# Patient Record
Sex: Female | Born: 1965 | Race: White | Hispanic: No | Marital: Married | Smoking: Never smoker
Health system: Southern US, Community
[De-identification: ages and names within clinical notes are randomized; demographics above are authoritative.]

## PROBLEM LIST (undated history)

## (undated) ENCOUNTER — Ambulatory Visit (HOSPITAL_COMMUNITY): Discharge status: Absent without leave | Payer: Self-pay

## (undated) DIAGNOSIS — E079 Disorder of thyroid, unspecified: Secondary | ICD-10-CM

## (undated) DIAGNOSIS — E785 Hyperlipidemia, unspecified: Secondary | ICD-10-CM

## (undated) DIAGNOSIS — I10 Essential (primary) hypertension: Secondary | ICD-10-CM

## (undated) HISTORY — PX: OTHER SURGICAL HISTORY: SHX169

## (undated) HISTORY — PX: APPENDECTOMY: SHX54

---

## 1996-11-15 HISTORY — PX: ANKLE SURGERY: SHX546

## 1999-05-23 ENCOUNTER — Emergency Department (HOSPITAL_COMMUNITY): Admission: EM | Admit: 1999-05-23 | Discharge: 1999-05-23 | Payer: Self-pay | Admitting: Emergency Medicine

## 2004-12-15 ENCOUNTER — Encounter: Admission: RE | Admit: 2004-12-15 | Discharge: 2004-12-15 | Payer: Self-pay | Admitting: Family Medicine

## 2005-12-17 ENCOUNTER — Encounter: Admission: RE | Admit: 2005-12-17 | Discharge: 2005-12-17 | Payer: Self-pay | Admitting: Family Medicine

## 2006-12-19 ENCOUNTER — Encounter: Admission: RE | Admit: 2006-12-19 | Discharge: 2006-12-19 | Payer: Self-pay | Admitting: Family Medicine

## 2007-11-16 HISTORY — PX: CHOLECYSTECTOMY: SHX55

## 2007-12-26 ENCOUNTER — Encounter: Admission: RE | Admit: 2007-12-26 | Discharge: 2007-12-26 | Payer: Self-pay | Admitting: Family Medicine

## 2008-01-10 ENCOUNTER — Emergency Department (HOSPITAL_COMMUNITY): Admission: EM | Admit: 2008-01-10 | Discharge: 2008-01-10 | Payer: Self-pay | Admitting: Emergency Medicine

## 2008-01-17 ENCOUNTER — Encounter (INDEPENDENT_AMBULATORY_CARE_PROVIDER_SITE_OTHER): Payer: Self-pay | Admitting: General Surgery

## 2008-01-18 ENCOUNTER — Inpatient Hospital Stay (HOSPITAL_COMMUNITY): Admission: RE | Admit: 2008-01-18 | Discharge: 2008-01-19 | Payer: Self-pay | Admitting: General Surgery

## 2008-01-22 ENCOUNTER — Ambulatory Visit: Payer: Self-pay | Admitting: Gastroenterology

## 2008-02-02 ENCOUNTER — Ambulatory Visit: Payer: Self-pay | Admitting: Gastroenterology

## 2008-02-02 LAB — CONVERTED CEMR LAB
ALT: 366 units/L — ABNORMAL HIGH (ref 0–35)
AST: 170 units/L — ABNORMAL HIGH (ref 0–37)
Albumin: 3 g/dL — ABNORMAL LOW (ref 3.5–5.2)
Alkaline Phosphatase: 134 units/L — ABNORMAL HIGH (ref 39–117)
Total Bilirubin: 3.2 mg/dL — ABNORMAL HIGH (ref 0.3–1.2)

## 2008-02-20 ENCOUNTER — Ambulatory Visit: Payer: Self-pay | Admitting: Gastroenterology

## 2008-02-20 LAB — CONVERTED CEMR LAB
ALT: 36 units/L — ABNORMAL HIGH (ref 0–35)
Albumin: 3.1 g/dL — ABNORMAL LOW (ref 3.5–5.2)
Bilirubin, Direct: 0.4 mg/dL — ABNORMAL HIGH (ref 0.0–0.3)
Total Protein: 6.7 g/dL (ref 6.0–8.3)

## 2008-03-14 ENCOUNTER — Encounter: Payer: Self-pay | Admitting: Gastroenterology

## 2008-03-14 ENCOUNTER — Ambulatory Visit (HOSPITAL_COMMUNITY): Admission: RE | Admit: 2008-03-14 | Discharge: 2008-03-14 | Payer: Self-pay | Admitting: Gastroenterology

## 2008-03-18 ENCOUNTER — Ambulatory Visit: Payer: Self-pay | Admitting: Gastroenterology

## 2008-12-26 ENCOUNTER — Ambulatory Visit (HOSPITAL_COMMUNITY): Admission: RE | Admit: 2008-12-26 | Discharge: 2008-12-26 | Payer: Self-pay | Admitting: Family Medicine

## 2009-01-22 IMAGING — CR DG CHEST 2V
2 series · 2 of 2 positions shown · non-contrast
Comparison: None.

CLINICAL DATA: Pre-op gallstones.  
 CHEST ? 2 VIEW:

[w chest pa *]
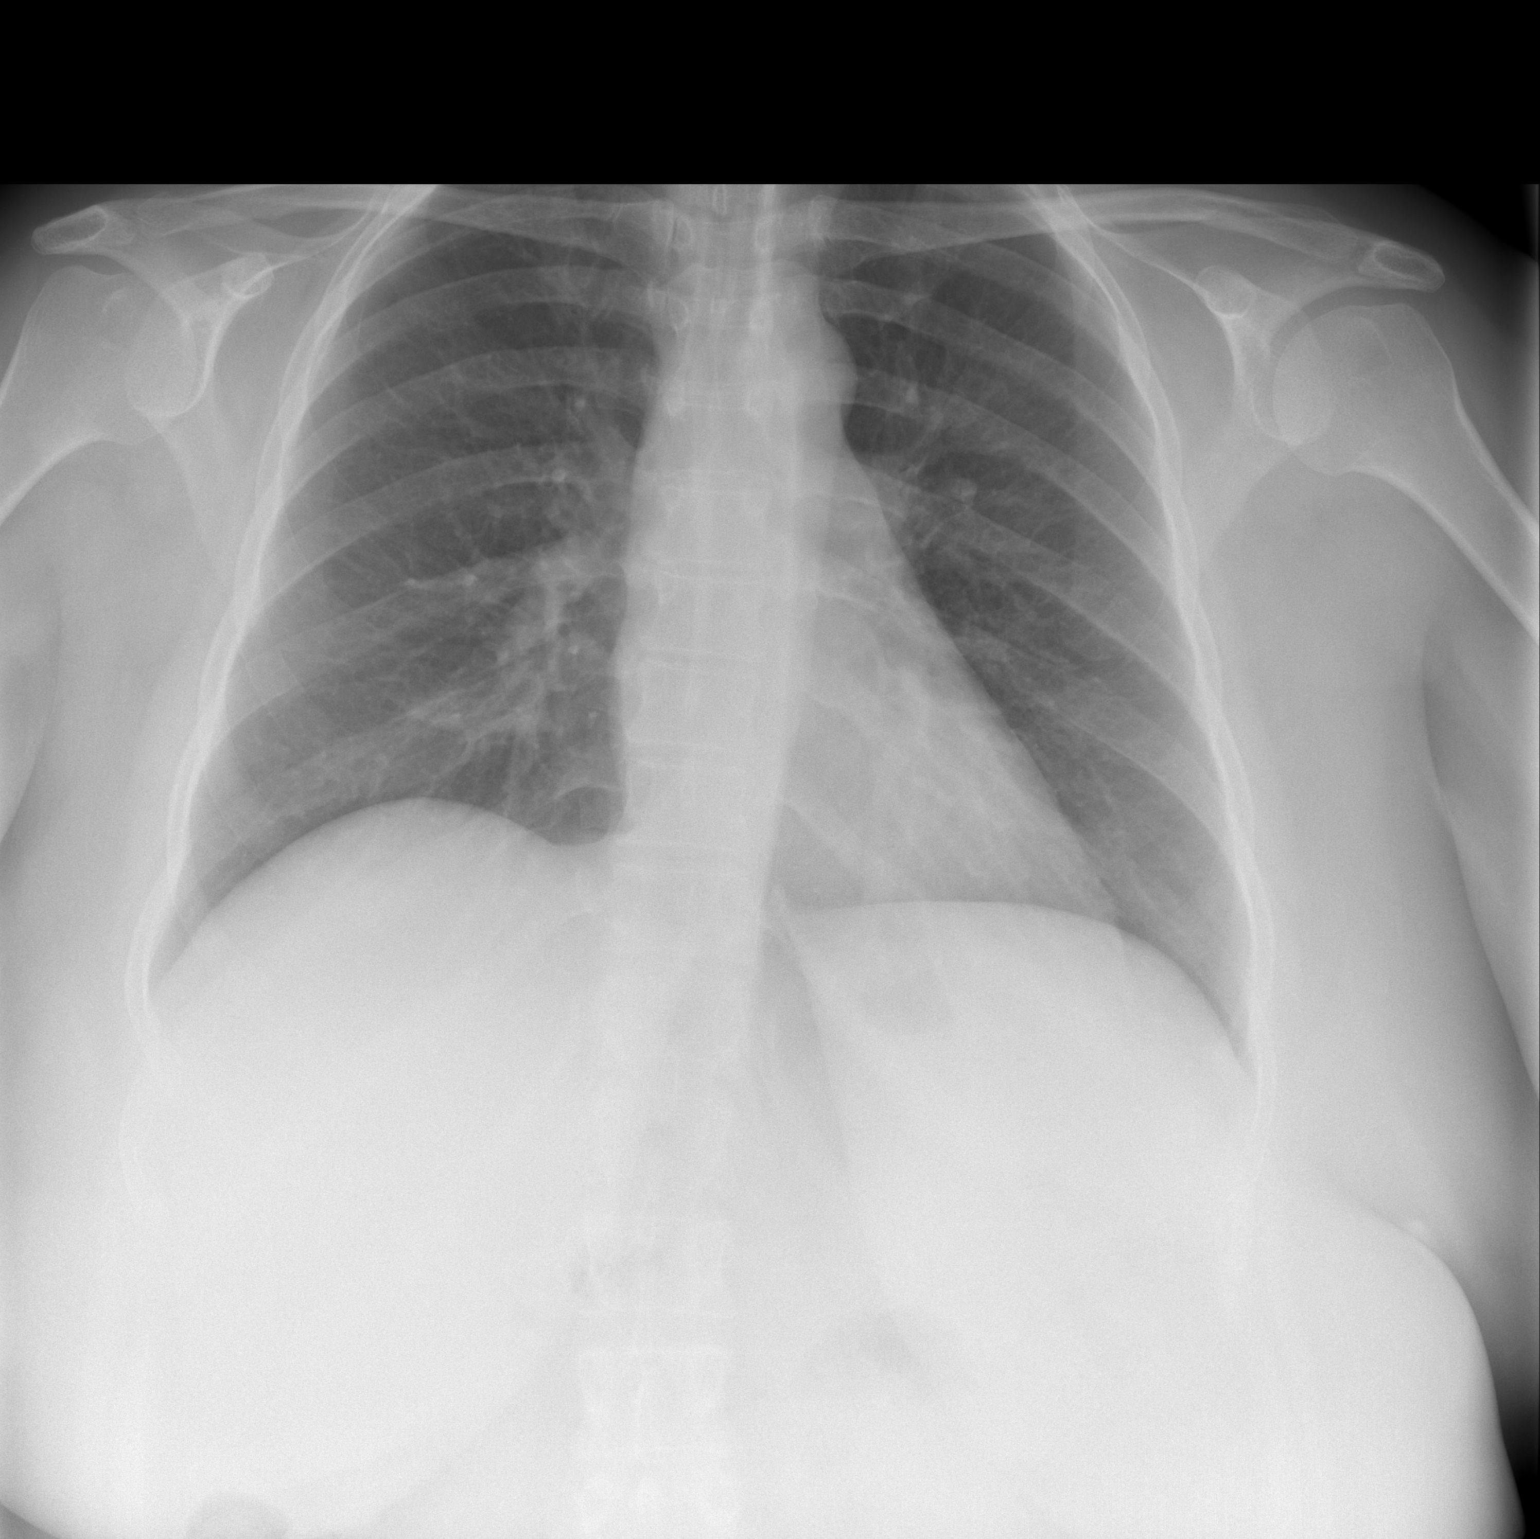

[w chest lat *]
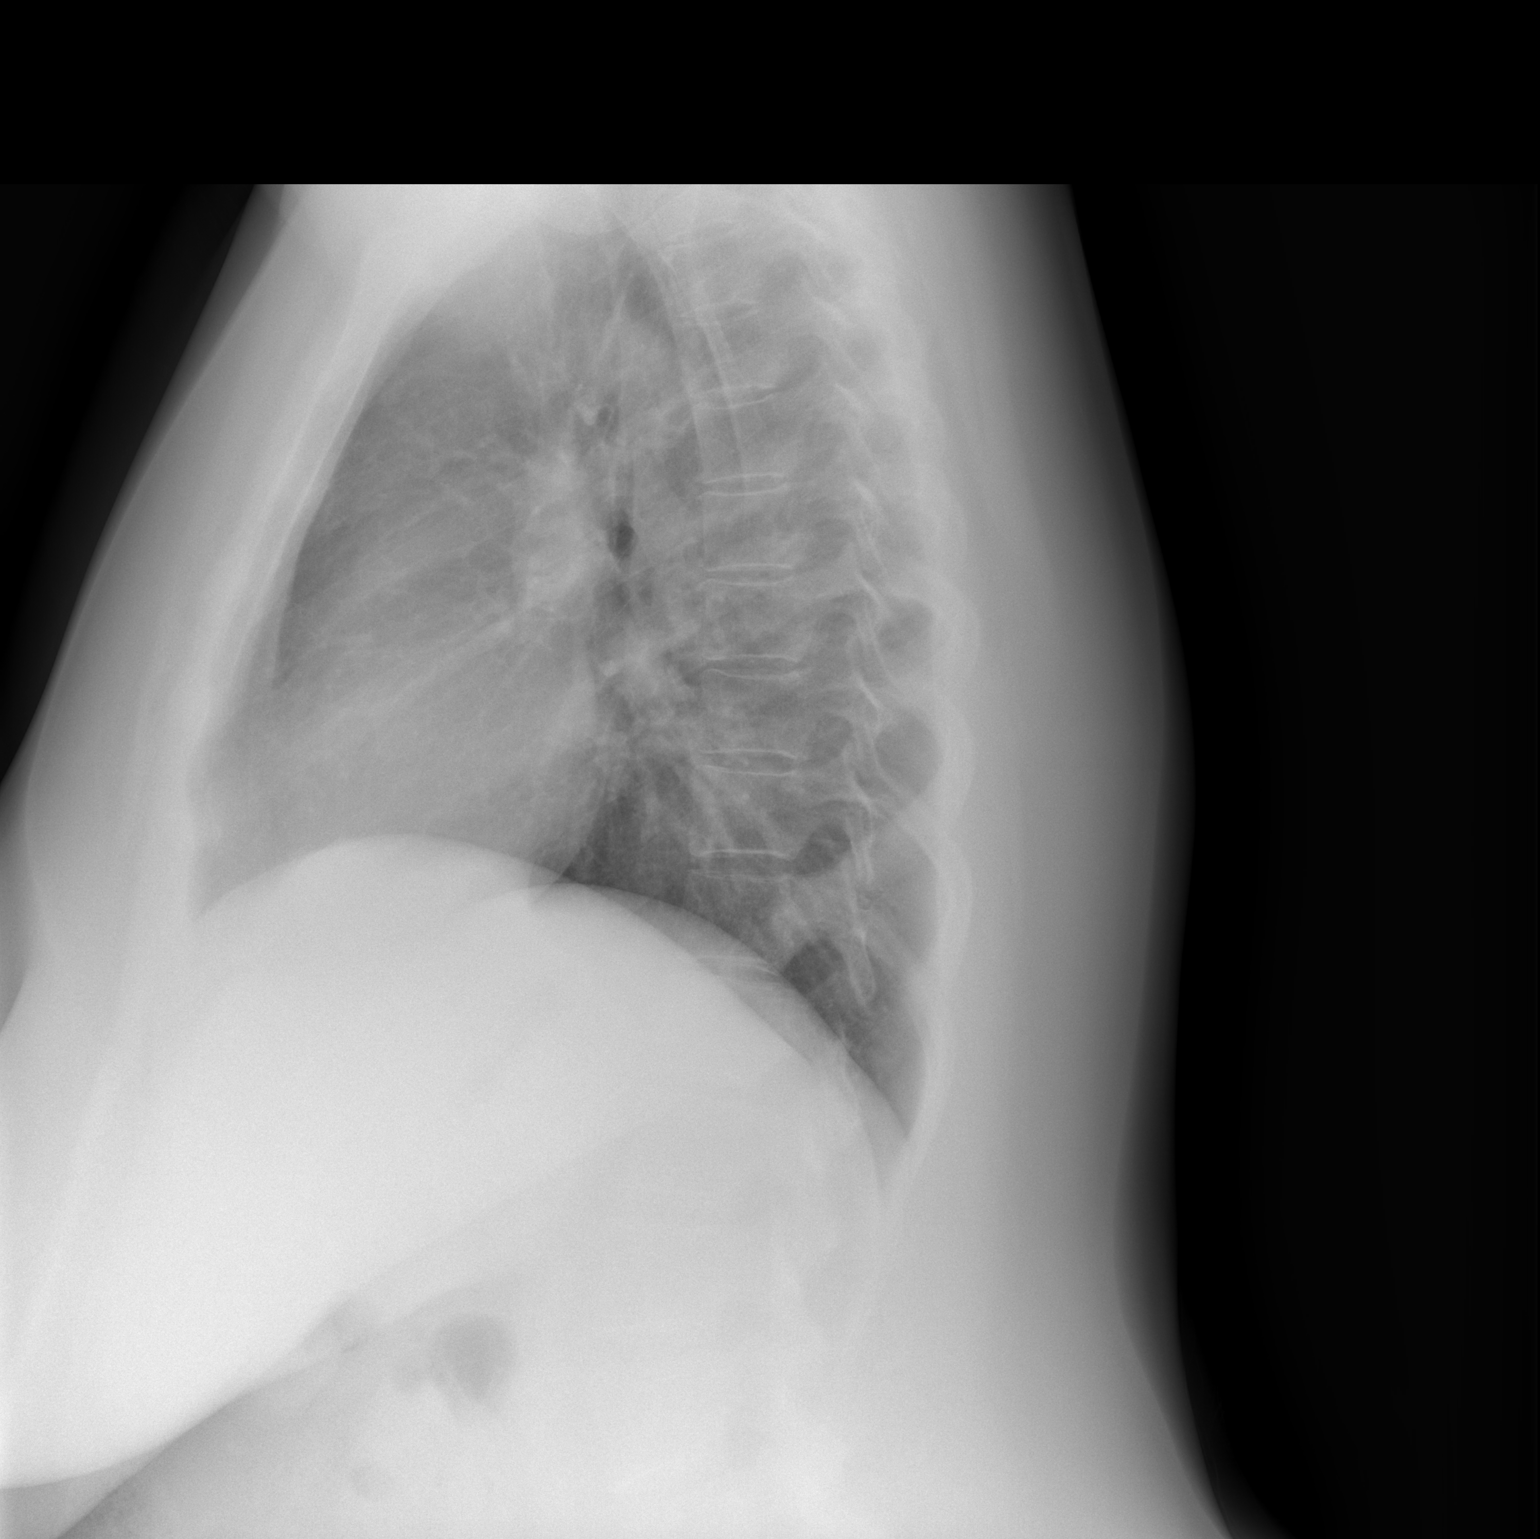

[2 of 2 positions shown; findings below may reference images not displayed]

FINDINGS: Trachea is midline.  Heart size normal.  Lungs are clear.  No pleural fluid.
IMPRESSION: No acute findings.

## 2009-01-22 IMAGING — RF DG CHOLANGIOGRAM OPERATIVE
1 series · 8 of 8 positions shown · non-contrast
Comparison: none

CLINICAL DATA: Gallstones

INTRAOPERATIVE CHOLANGIOGRAM:
TECHNIQUE: Multiple fluoroscopic spot radiographs were obtained during
intraoperative cholangiogram, and are submitted for interpretation
post-operatively.

[Series 1: run · 2 acquisitions, 8 frames shown]
[im 1/2]
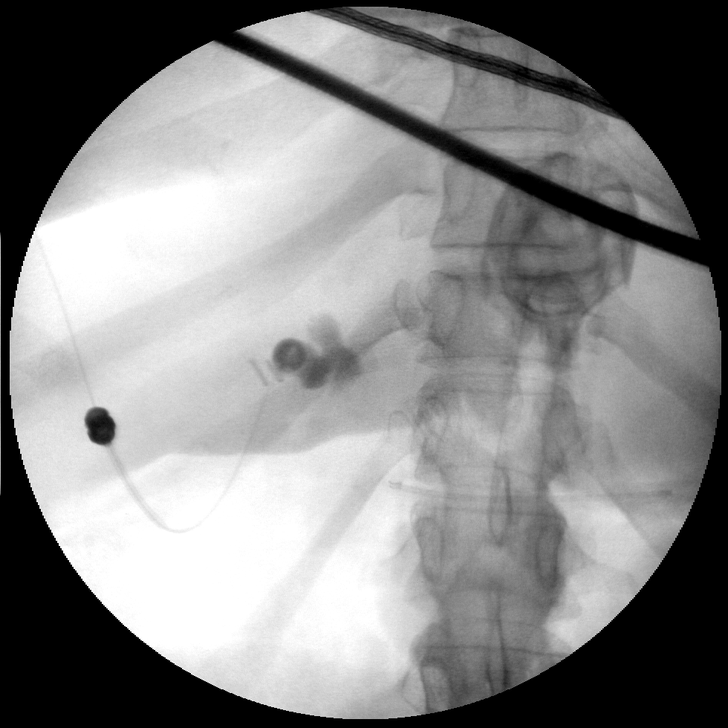
[im 1/2]
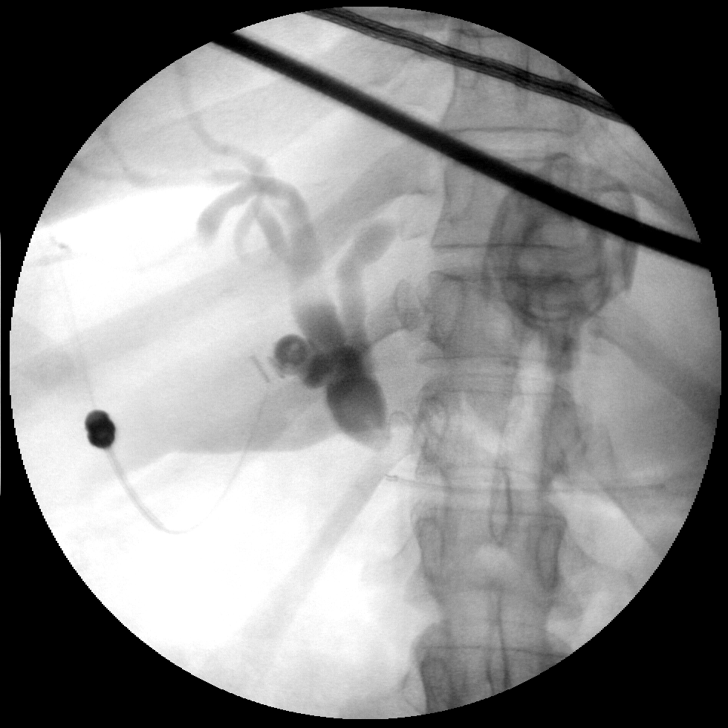
[im 1/2]
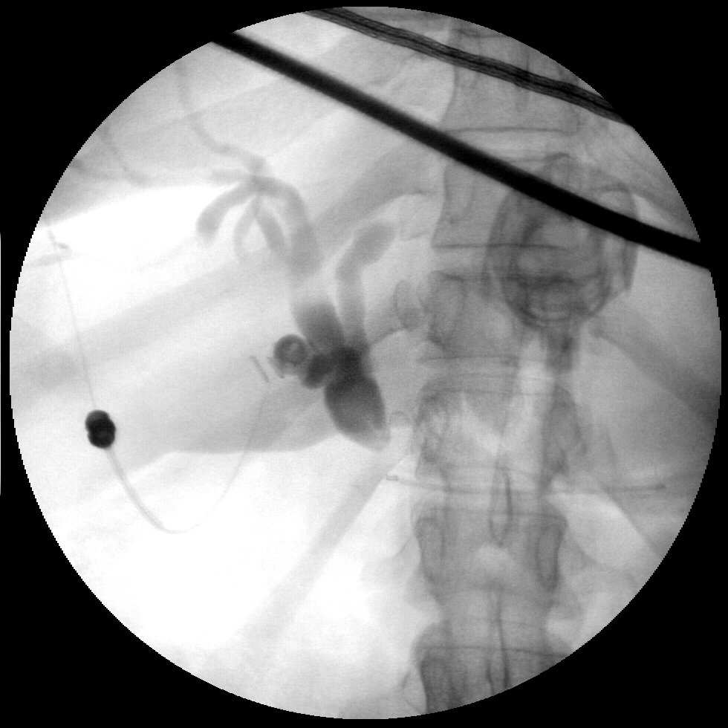
[im 1/2]
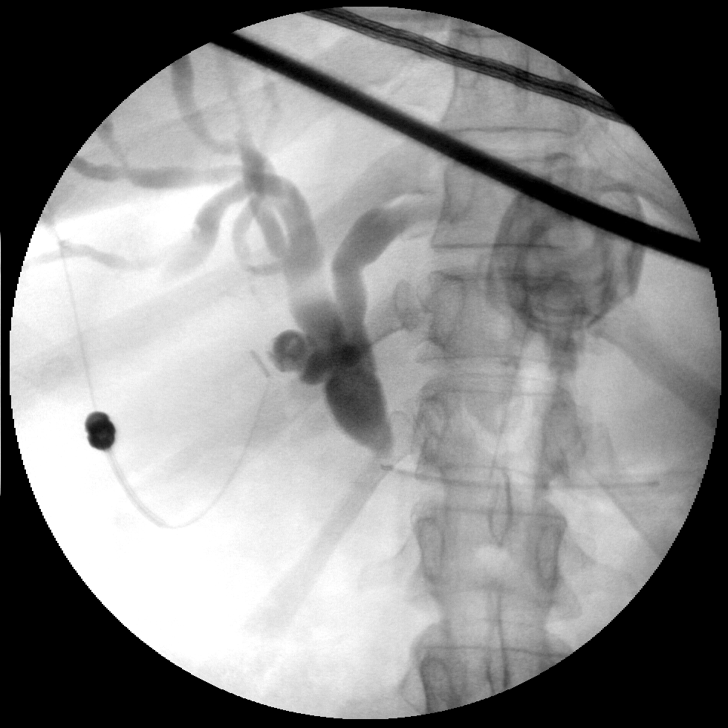
[im 2/2]
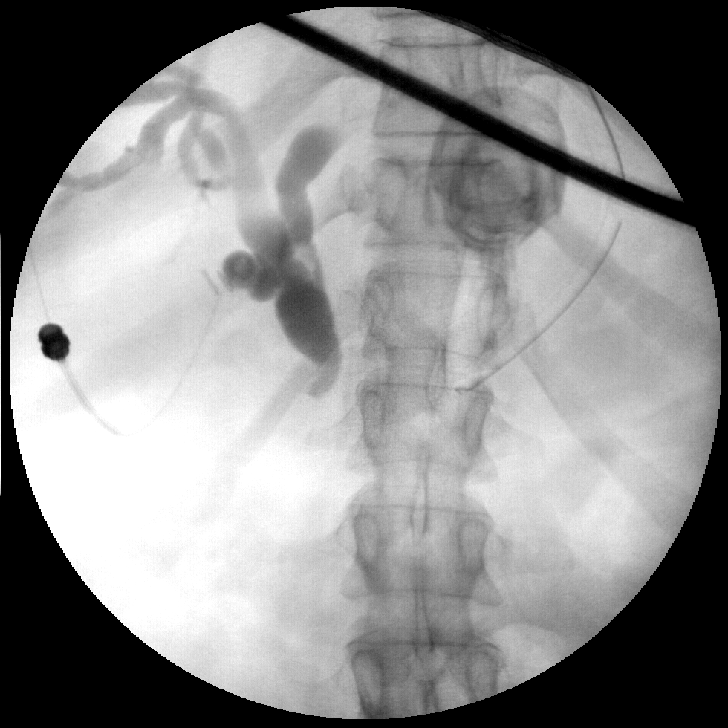
[im 2/2]
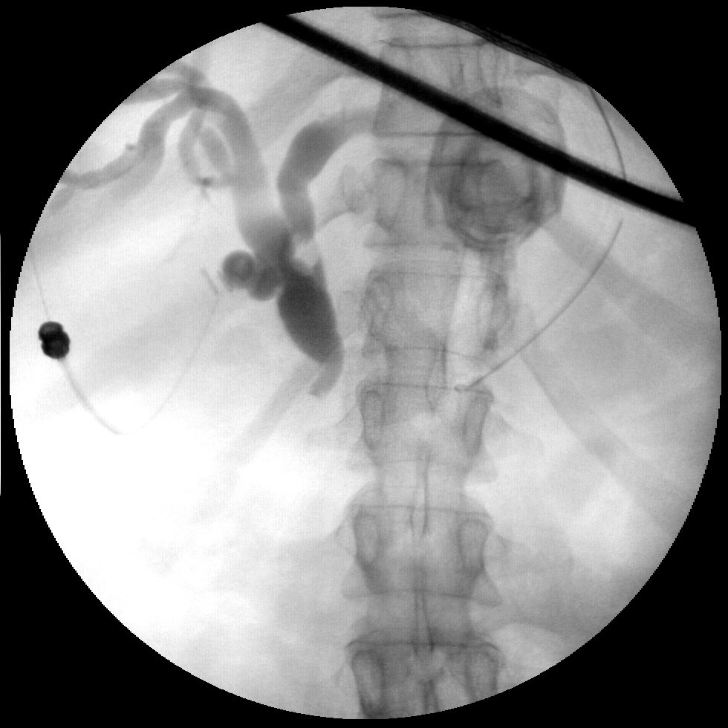
[im 2/2]
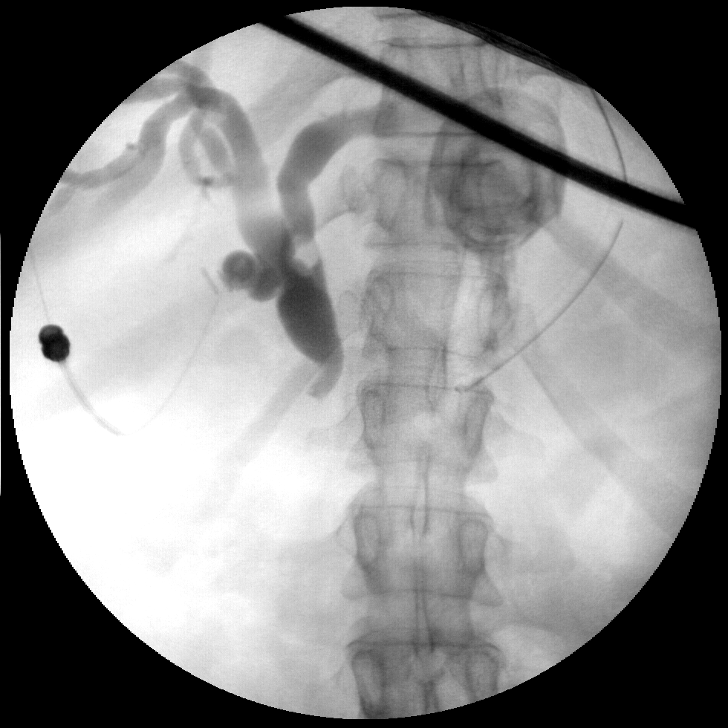
[im 2/2]
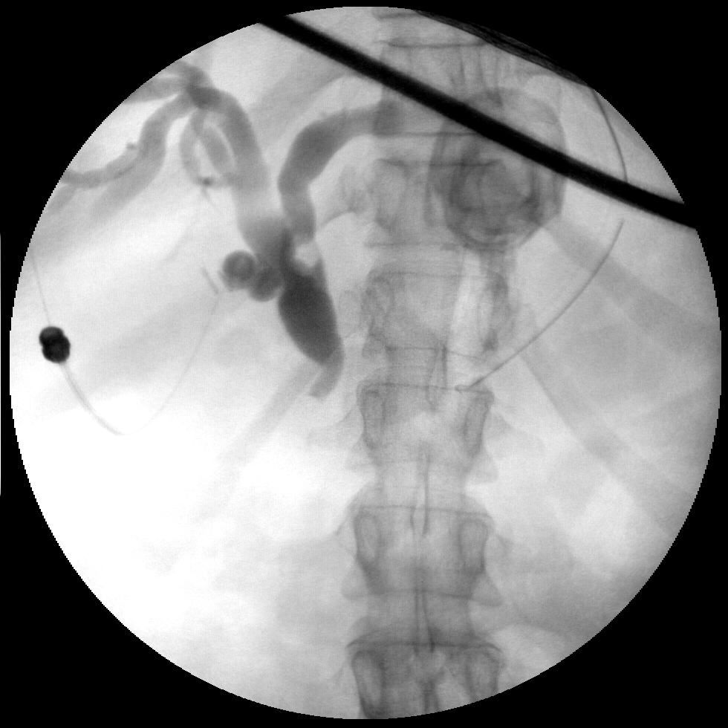

[8 of 8 positions shown; findings below may reference images not displayed]

FINDINGS: Two cine runs are submitted from intraoperative cholangiogram. The
showed dilated biliary system. There is a persistent filling defect noted in the
proximal common bile duct or possibly left common hepatic duct compatible with
large retained stone. Contrast never passes into the small bowel. Suspect distal
obstructing stone.
IMPRESSION: Retained stones with no passage of contrast into the small bowel. Biliary system
dilated.

## 2010-01-07 ENCOUNTER — Ambulatory Visit (HOSPITAL_COMMUNITY): Admission: RE | Admit: 2010-01-07 | Discharge: 2010-01-07 | Payer: Self-pay | Admitting: Family Medicine

## 2010-12-07 ENCOUNTER — Other Ambulatory Visit (HOSPITAL_COMMUNITY): Payer: Self-pay | Admitting: Family Medicine

## 2010-12-07 DIAGNOSIS — Z139 Encounter for screening, unspecified: Secondary | ICD-10-CM

## 2010-12-07 DIAGNOSIS — Z1231 Encounter for screening mammogram for malignant neoplasm of breast: Secondary | ICD-10-CM

## 2011-01-12 ENCOUNTER — Inpatient Hospital Stay (HOSPITAL_COMMUNITY): Admission: RE | Admit: 2011-01-12 | Payer: Self-pay | Source: Ambulatory Visit

## 2011-01-12 ENCOUNTER — Ambulatory Visit (HOSPITAL_COMMUNITY): Payer: Self-pay

## 2011-01-18 ENCOUNTER — Ambulatory Visit (HOSPITAL_COMMUNITY)
Admission: RE | Admit: 2011-01-18 | Discharge: 2011-01-18 | Disposition: A | Discharge status: Home / Self Care | Payer: Self-pay | Source: Ambulatory Visit | Attending: Family Medicine | Admitting: Family Medicine

## 2011-01-18 DIAGNOSIS — Z1231 Encounter for screening mammogram for malignant neoplasm of breast: Secondary | ICD-10-CM | POA: Insufficient documentation

## 2011-03-30 NOTE — Assessment & Plan Note (Signed)
[facility]                         GASTROENTEROLOGY OFFICE NOTE   Sharon, Gallegos                   MRN:          [id number]  DATE:02/02/2008                            DOB:          [birth year]    GENERAL SURGEON:  Dr. Thornton Dales.   GASTROINTESTINAL PROBLEM LIST:  Recent laparoscopic cholecystectomy for  symptomatic gallstones, March 2009, by Dr. Thornton Dales.  Intraoperative cholangiogram showed retained stones in the common bile  duct and the patient showed up the day of her procedure quite jaundiced.  Total bilirubin was 15.  Pathology confirmed chronic cholecystitis and  cholelithiasis.  Endoscopic retrograde cholangiopancreatogram performed  the day after her surgery showed a dilated common bile duct with  choledocholithiasis, treated with biliary sphincterotomy, lithotripsy,  balloon sweeping of the bile duct.  A 7-French 7-cm-long plastic removal  stent was placed into the duct.   INTERIM HISTORY:  Since leaving the hospital, Sharon Gallegos has felt very well.  Her pruritus has improved.  Her urine and stool have gone back to their  normal color.  The yellowing of her eyes is greatly improved.  She had  the drained pulled by Dr. Zachery Dakins shortly after she left the hospital.  She had no fever or chills.   CURRENT MEDICATIONS:  Birth control pills.   PHYSICAL EXAMINATION:  VITAL SIGNS:  Five feet 3 inches, 216 pounds.  Blood pressure 116/74, pulse 88.  CONSTITUTIONAL:  Obese, otherwise well appearing.  Sclerae are very slightly icteric, although it is tough to know with the  lightening in the examination room.  Certainly, she is not overtly  jaundiced.  ABDOMEN:  Obese, otherwise soft, nontender and non-distended with normal  bowel sounds.   ASSESSMENT AND PLAN:  [age]-year-old woman status post endoscopic  retrograde cholangiopancreatogram, sphincterotomy and stenting for  retained common bile duct stones.   We will get a  basic set of liver tests today on her.  I expect they will  be greatly improved and my plan will be to repeat endoscopic retrograde  cholangiopancreatogram in approximately 6-8 weeks' time, as long as she  is not having any further symptoms or recurrence of jaundice.     Sharon Fee, MD  Electronically Signed    DPJ/MedQ  DD: 02/02/2008  DT: 02/02/2008  Job #: [id number]   cc:   Anselm Pancoast. Zachery Dakins, M.D.

## 2011-03-30 NOTE — Op Note (Signed)
[name], [name]            ACCOUNT NO.:  [id number]   MEDICAL RECORD NO.:  [id number]          PATIENT TYPE:  OIB   LOCATION:  1526                         FACILITY:  [facility]   PHYSICIAN:  Sharon Gallegos, M.D.DATE OF BIRTH:  25-Sep-1966   DATE OF PROCEDURE:  01/17/2008  DATE OF DISCHARGE:                               OPERATIVE REPORT   PREOPERATIVE DIAGNOSIS:  Chronic cholecystitis with possible common duct  stone.   POSTOPERATIVE DIAGNOSIS:  Chronic subacute cholecystitis with common  duct stone.   OPERATION:  Laparoscopic cholecystectomy with cholangiogram.   ANESTHESIA:  General anesthesia.   SURGEON:  Sharon Pancoast. Zachery Dakins, M.D.   ASSISTANT:  Velora Heckler, M.D.   HISTORY:  Sharon Gallegos is a 45 year old female who was referred to  our office last Thursday with an episode of epigastric pain.  She had  been seen by Dr. Herb Grays who referred her to the emergency room.  Then, she went to the emergency room, I think at [facility], and they referred  her to our office. On examination, she was not having acute pain then,  but she had had episodes of epigastric pain and I scheduled her for an  urgent cholecystectomy and the next available time was today. Over the  weekend, she started having more pain and was noticed that she was  getting a little yellow.  She did not call anybody.  When she showed up  today she was obviously jaundiced.  As far as on past history, when she  was 45 years old, she had a ruptured appendicitis and I think actually  helped Dr. Samuella Cota do a laparotomy and lysis of adhesions and etc. at that  time. She has had no other surgery with the exception of some surgery on  her left hip. She is moderately overweight but otherwise, is in good  health.  Preoperatively, she had an H&H and I thought a CMP had been  drawn but when we got back to the operating room, it was apparent that  it had not been drawn and we proceeded on with cholecystectomy.   DESCRIPTION OF PROCEDURE:  The abdomen was prepped, after induction of  general anesthesia and endotracheal tube, with Betadine solution and  draped in a sterile manner.  She has got a big midline incision from her  previous surgery and a small incision was made below the umbilicus.  The  fascia identified, picked up between two Kochers, and very carefully  opened through the posterior rectus fascia and then the peritoneum  picked up and a small opening made and carefully entered into the  peritoneal cavity.  A Hassan cannula was introduced.  She does have  adhesions, more on the right lower abdomen than the upper abdomen, but  there are some adhesions over the liver and the 5 mm ports were placed  by Dr. Gerrit Friends next so that I could see the midline a little easier  because of adhesions right around the umbilicus area.  We then  anesthetized the fascia and I put in the upper 10 mm port.  The  gallbladder was very tense, subacutely  inflamed, and first we used the  Nezhat aspirator to decompress the gallbladder.  This was completed and  then reflection pulling the gallbladder up there were adhesions around  proximally and we carefully opened the peritoneum down at the most  proximal portion of the gallbladder.  It appeared that her bile duct is  dilated and the cystic duct was definitely dilated and we elevated this  opening the peritoneum and then I very carefully dissected the areolar  tissue and fatty tissue off the common bile duct which I could encompass  with the right angle.  After we were sure that we were definitely on the  cystic duct and not the common bile duct, I then tried to get a clip  across the cystic duct but it is large enough that the clip does not  completely occlude it.  I then made a little incision just proximal to  this and put a Reddick catheter within the cystic duct, blew up the  balloon and used a clip to occlude it and there was extra pressure.  Obviously, she  has got a common duct stone.  X-rays were then obtained  and we could see an extrahepatic dilatation.  It appears that there is  probably a stone in the junction of the common bile duct where the  cystic duct sort of comes in and then probably some small stones more on  the distal portion of the common bile duct.  We released the balloon,  deflated the Reddick catheter, and then put an endoloop across the  proximal common bile duct, two endoloops of Prolene were used for good  closure of this.  Next, we could see the cystic artery that was behind  the cystic duct and it was doubly clipped proximally, singly distally,  and divided, and then another little area that may have been a posterior  branch of the cystic artery was clipped and then we could free up the  gallbladder and removed it from its bed.  I then placed the gallbladder  in an EndoCatch bag and looked down in the area and it appears that we  have got adequate hemostasis.  I cauterized a few little areas where the  raw surface of the liver was and then put a small piece of Surgicel in  the area after inspecting the cystic duct and arteries and everything  looked satisfactory.  I then put a Blake drain in the bed of the  gallbladder, brought it out through the most lateral 5 mm port, and then  switched the camera to the upper 10 mm port and withdrew the gallbladder  within the EndoCatch bag.  Reinspection, the little irrigating fluid was  aspirated, it did not look like there was any significant bleeding and  the drain is still in a good position.  I then placed an additional  figure-of-eight of 0 Vicryl in the fascia at the umbilicus.  We could  see the under surface of the fascia with the camera in the upper 10 mm  port.  We withdrew the other 5 mm port and then released the carbon  dioxide and removed the upper 10 mm port.  The patient tolerated the  procedure well. We will get a bilirubin at this time and I talked with  Dr.  Arlyce Dice and Dr. Christella Hartigan about seeing if they could do an ERCP this  evening.           ______________________________  Sharon Pancoast. Zachery Dakins, M.D.  WJW/MEDQ  D:  01/17/2008  T:  01/17/2008  Job:  [id number]   cc:   Tammy R. Collins [place], M.D.  Fax: [phone]

## 2011-08-09 LAB — COMPREHENSIVE METABOLIC PANEL
AST: 32
Albumin: 2.4 — ABNORMAL LOW
Alkaline Phosphatase: 177 — ABNORMAL HIGH
BUN: 8
Calcium: 8.6
Chloride: 102
Creatinine, Ser: 0.78
GFR calc Af Amer: >60
Glucose, Bld: 176 — ABNORMAL HIGH
Potassium: 4.7
Total Protein: 5.9 — ABNORMAL LOW
Total Protein: 6.6

## 2011-08-09 LAB — HEMOGLOBIN AND HEMATOCRIT, BLOOD: Hemoglobin: 13.9

## 2011-08-09 LAB — CBC
MCHC: 35.1
MCV: 85.9
Platelets: 348
WBC: 11.1 — ABNORMAL HIGH

## 2011-08-09 LAB — PROTIME-INR
INR: 1
Prothrombin Time: 13.1

## 2011-08-09 LAB — PREGNANCY, URINE: Preg Test, Ur: NEGATIVE

## 2011-10-19 ENCOUNTER — Telehealth (INDEPENDENT_AMBULATORY_CARE_PROVIDER_SITE_OTHER): Payer: Self-pay

## 2011-10-19 NOTE — Telephone Encounter (Signed)
Returned [employer]. Pt wanted to know if she could exercise before she comes to the appt with Dr Dwain Sarna for 10-26-11 with her problem being a periumbilical lesion. I advised her to do her normal activities and exercise as long as it doesn't hurt than go ahead with the activities if it hurts don't do it. The pt understands./ [facility]

## 2011-10-26 ENCOUNTER — Ambulatory Visit (INDEPENDENT_AMBULATORY_CARE_PROVIDER_SITE_OTHER): Payer: Self-pay | Admitting: General Surgery

## 2011-10-26 ENCOUNTER — Encounter (INDEPENDENT_AMBULATORY_CARE_PROVIDER_SITE_OTHER): Payer: Self-pay | Admitting: General Surgery

## 2011-10-26 ENCOUNTER — Encounter (INDEPENDENT_AMBULATORY_CARE_PROVIDER_SITE_OTHER): Payer: Self-pay

## 2011-10-26 VITALS — BP 142/86 | HR 68 | Temp 98.6°F | Resp 18 | Ht 63.0 in | Wt 276.4 lb

## 2011-10-26 DIAGNOSIS — L988 Other specified disorders of the skin and subcutaneous tissue: Secondary | ICD-10-CM

## 2011-10-26 DIAGNOSIS — L089 Local infection of the skin and subcutaneous tissue, unspecified: Secondary | ICD-10-CM

## 2011-10-26 NOTE — Progress Notes (Signed)
Subjective:     Patient ID: Sharon Gallegos, female   DOB: 10-12-1966, 45 y.o.   MRN: [id number]  HPI This is a 45 year old female who has a history of an appendectomy as a child as well as a laparoscopic cholecystectomy. The last surgery was done 3 years ago. She comes in today after she began on Thanksgiving having some yellowish drainage that is coming from near her umbilicus. This is causing some pain and as well as being associated with a little bit of bloody fluid also. She has no symptoms of any obstruction and no intra-abdominal symptoms I can figure out today. She has no fevers or any redness that she is noted around this. She was evaluated and underwent an ultrasound that shows a small complex periumbilical subcutaneous nodular lesion extending toward the skin surface. She comes in today to discuss therapy.  Review of Systems  Constitutional: Negative for fever, chills and unexpected weight change.  HENT: Negative for hearing loss, congestion, sore throat, trouble swallowing and voice change.   Eyes: Negative for visual disturbance.  Respiratory: Negative for cough and wheezing.   Cardiovascular: Negative for chest pain, palpitations and leg swelling.  Gastrointestinal: Negative for nausea, vomiting, abdominal pain, diarrhea, constipation, blood in stool, abdominal distention and anal bleeding.  Genitourinary: Negative for hematuria, vaginal bleeding and difficulty urinating.  Musculoskeletal: Negative for arthralgias.  Skin: Negative for rash and wound.  Neurological: Negative for seizures, syncope and headaches.  Hematological: Negative for adenopathy. Does not bruise/bleed easily.  Psychiatric/Behavioral: Negative for confusion.       Objective:   Physical Exam  Constitutional: She appears well-developed and well-nourished.  Abdominal: Soft. Normal appearance. There is no tenderness.         Assessment:  Umbilical nodule, ?fistula    Plan:     I'm not sure entirely  what is causing this. I think the best plan would be to evaluate with this with a CT scan to ensure there is no intra-abdominal source of this. Following I will have her return to talk to me about the plan.

## 2011-11-12 ENCOUNTER — Encounter (INDEPENDENT_AMBULATORY_CARE_PROVIDER_SITE_OTHER): Payer: Self-pay | Admitting: General Surgery

## 2011-11-12 ENCOUNTER — Encounter (HOSPITAL_COMMUNITY): Payer: Self-pay

## 2011-11-12 ENCOUNTER — Ambulatory Visit (HOSPITAL_COMMUNITY)
Admission: RE | Admit: 2011-11-12 | Discharge: 2011-11-12 | Disposition: A | Discharge status: Home / Self Care | Payer: Self-pay | Source: Ambulatory Visit | Attending: General Surgery | Admitting: General Surgery

## 2011-11-12 DIAGNOSIS — L988 Other specified disorders of the skin and subcutaneous tissue: Secondary | ICD-10-CM

## 2011-11-12 DIAGNOSIS — R1909 Other intra-abdominal and pelvic swelling, mass and lump: Secondary | ICD-10-CM | POA: Insufficient documentation

## 2011-11-12 MED ORDER — IOHEXOL 300 MG/ML  SOLN
100.0000 mL | Freq: Once | INTRAMUSCULAR | Status: AC | PRN
  Administered 2011-11-12: 100 mL via INTRAVENOUS

## 2011-11-15 ENCOUNTER — Encounter (INDEPENDENT_AMBULATORY_CARE_PROVIDER_SITE_OTHER): Payer: Self-pay | Admitting: Physician Assistant

## 2011-11-17 ENCOUNTER — Encounter (INDEPENDENT_AMBULATORY_CARE_PROVIDER_SITE_OTHER): Payer: Self-pay | Admitting: General Surgery

## 2011-11-17 ENCOUNTER — Ambulatory Visit (INDEPENDENT_AMBULATORY_CARE_PROVIDER_SITE_OTHER): Payer: Self-pay | Admitting: General Surgery

## 2011-11-17 VITALS — BP 128/88 | HR 70 | Temp 97.8°F | Resp 14 | Ht 63.0 in | Wt 276.6 lb

## 2011-11-17 DIAGNOSIS — R1909 Other intra-abdominal and pelvic swelling, mass and lump: Secondary | ICD-10-CM

## 2011-11-17 HISTORY — PX: HERNIA REPAIR: SHX51

## 2011-11-17 NOTE — Progress Notes (Signed)
Patient ID: Sharon Gallegos, female   DOB: March 06, 1966, 46 y.o.   MRN: [id number]  Chief Complaint  Patient presents with  . Follow-up    HPI Sharon Gallegos is a 46 y.o. female.   HPI This is a 46 year old female who has a history of an appendectomy as a child as well as a laparoscopic cholecystectomy. The last surgery was done 3 years ago. She comes in today after she began on Thanksgiving having some yellowish drainage that is coming from near her umbilicus. This is causing some pain and as well as being associated with a little bit of bloody fluid also. She has no symptoms of any obstruction and no intra-abdominal symptoms I can figure out today. She has no fevers or any redness that she is noted around this. She was evaluated and underwent an ultrasound that shows a small complex periumbilical subcutaneous nodular lesion extending toward the skin surface. I sent her for ct scan to ensure there is no intra-abdominal connection and it appears this is all in abdominal wall.  She comes in today to review ct scan and to come up with a final plan.  There has been no change in her symptoms since the last visit.  History reviewed. No pertinent past medical history.  Past Surgical History  Procedure Date  . Appendectomy 1976/1977  . Cholecystectomy 2009  . Ankle surgery 1998    6 ankle surgeries     History reviewed. No pertinent family history.  Social History History  Substance Use Topics  . Smoking status: Never Smoker   . Smokeless tobacco: Never Used  . Alcohol Use: No    No Known Allergies  Current Outpatient Prescriptions  Medication Sig Dispense Refill  . levonorgestrel-ethinyl estradiol (AVIANE,ALESSE,LESSINA) 0.1-20 MG-MCG tablet Take 1 tablet by mouth daily.          Review of Systems Review of Systems  Blood pressure 128/88, pulse 70, temperature 97.8 F (36.6 C), temperature source Temporal, resp. rate 14, height 5\' 3"  (1.6 m), weight 276 lb 9.6 oz (125.465  kg).  Physical Exam Physical Exam  Constitutional: She appears well-developed and well-nourished.  Cardiovascular: Normal rate, regular rhythm and normal heart sounds.   Pulmonary/Chest: Effort normal. She has no wheezes. She has no rales.  Abdominal: Soft.      Data Reviewed CT ABDOMEN AND PELVIS WITH CONTRAST  Technique: Multidetector CT imaging of the abdomen and pelvis was  performed following the standard protocol during bolus  administration of intravenous contrast.  Contrast: OMNIPAQUE IOHEXOL 300 MG/ML IV SOLN  Comparison: None.  Findings: Intra-abdominal detail is mildly limited by body habitus.  The lung bases are clear. There is no biliary dilatation status  post cholecystectomy. The liver, spleen, pancreas, adrenal glands  and kidneys appear normal.  There is a 1 cm subcutaneous nodule deep to the umbilicus which  demonstrates no air within it. There is no surrounding  inflammatory change or intra-abdominal extension. No umbilical  hernia is evident. The bowel gas pattern is normal.  There is low density prominence of the left ovary which measures  4.1 x 2.9 cm on image 72. There is a dependent calcification (or  adjacent surgical clip based on the scout appearance) within the  left ovary on images 73 and 74. The right ovary and uterus appear  unremarkable. The bladder appears unremarkable. No urachal  remnant is evident. Mild facet degenerative changes of the lower  lumbar spine are noted.  IMPRESSION:  1. A small  subcutaneous nodule at the umbilicus may correspond  with the patient's palpable concern. No draining sinus, intra-  abdominal extension or associated hernia demonstrated.  2. Suspected small left ovarian cyst, possibly functional.   Assessment    Umbilical nodule with draining sinus    Plan    This area does appear to be subcutaneous in nature and may very well be likely related to her old scar that is fair. I discussed observation which I  did not recommend is I'm concerned that she might have a worsening infection in that area. I recommended excision of this subcutaneous nodule as well as a sinus that continues to drain. This may require removal of port over umbilicus and this may certainly look different upon completion. I discussed with her the risks including but not limited to recurrence, open wound that will heal by secondary intention, bleeding, and infection. She understands all he is risks and would like to proceed with surgery as soon as possible.      Sharon Gallegos 11/17/2011, 10:17 AM

## 2011-11-18 ENCOUNTER — Encounter (HOSPITAL_COMMUNITY): Payer: Self-pay | Admitting: Pharmacy Technician

## 2011-11-19 ENCOUNTER — Encounter (INDEPENDENT_AMBULATORY_CARE_PROVIDER_SITE_OTHER): Payer: Self-pay | Admitting: General Surgery

## 2011-11-26 ENCOUNTER — Encounter (HOSPITAL_COMMUNITY): Payer: Self-pay

## 2011-11-26 ENCOUNTER — Encounter (HOSPITAL_COMMUNITY)
Admission: RE | Admit: 2011-11-26 | Discharge: 2011-11-26 | Disposition: A | Discharge status: Home / Self Care | Payer: Self-pay | Source: Ambulatory Visit | Attending: General Surgery | Admitting: General Surgery

## 2011-11-26 LAB — SURGICAL PCR SCREEN: MRSA, PCR: NEGATIVE

## 2011-11-26 LAB — CBC
MCH: 28.8 pg (ref 26.0–34.0)
MCV: 87.7 fL (ref 78.0–100.0)
Platelets: 353 10*3/uL (ref 150–400)
RDW: 13.1 % (ref 11.5–15.5)

## 2011-11-26 LAB — BASIC METABOLIC PANEL
BUN: 11 mg/dL (ref 6–23)
CO2: 23 mEq/L (ref 19–32)
Calcium: 9.8 mg/dL (ref 8.4–10.5)
Creatinine, Ser: 0.7 mg/dL (ref 0.50–1.10)
Glucose, Bld: 130 mg/dL — ABNORMAL HIGH (ref 70–99)

## 2011-11-26 NOTE — Patient Instructions (Signed)
20 NICOL HERBIG  11/26/2011   Your procedure is scheduled on:  12/03/11  Report to [facility] at 8:00 AM.  Call this number if you have problems the morning of surgery: [phone]   Remember:   Do not eat food:After Midnight.  May have clear liquids:until Midnight .  Clear liquids include soda, tea, black coffee, apple or grape juice, broth.  Take these medicines the morning of surgery with A SIP OF WATER: none   Do not wear jewelry, make-up or nail polish.  Do not wear lotions, powders, or perfumes.   Do not shave 48 hours prior to surgery.  Do not bring valuables to the hospital.  Contacts, dentures or bridgework may not be worn into surgery.  Leave suitcase in the car. After surgery it may be brought to your room.  For patients admitted to the hospital, checkout time is 11:00 AM the day of discharge.   Patients discharged the day of surgery will not be allowed to drive home.  Name and phone number of your driver:   Special Instructions: CHG Shower Use Special Wash: 1/2 bottle night before surgery and 1/2 bottle morning of surgery.   Please read over the following fact sheets that you were given: MRSA Information             No aspirin or herbal medication 5-7 days peop

## 2011-12-03 ENCOUNTER — Other Ambulatory Visit (INDEPENDENT_AMBULATORY_CARE_PROVIDER_SITE_OTHER): Payer: Self-pay | Admitting: General Surgery

## 2011-12-03 ENCOUNTER — Encounter (HOSPITAL_COMMUNITY)
Admission: RE | Disposition: A | Discharge status: Home / Self Care | Payer: Self-pay | Source: Ambulatory Visit | Attending: General Surgery

## 2011-12-03 ENCOUNTER — Encounter (HOSPITAL_COMMUNITY): Payer: Self-pay | Admitting: Anesthesiology

## 2011-12-03 ENCOUNTER — Ambulatory Visit (HOSPITAL_COMMUNITY)
Admission: RE | Admit: 2011-12-03 | Discharge: 2011-12-03 | Disposition: A | Discharge status: Home / Self Care | Payer: Self-pay | Source: Ambulatory Visit | Attending: General Surgery | Admitting: General Surgery

## 2011-12-03 ENCOUNTER — Encounter (HOSPITAL_COMMUNITY): Payer: Self-pay | Admitting: *Deleted

## 2011-12-03 ENCOUNTER — Ambulatory Visit (HOSPITAL_COMMUNITY): Payer: Self-pay | Admitting: Anesthesiology

## 2011-12-03 DIAGNOSIS — Z01812 Encounter for preprocedural laboratory examination: Secondary | ICD-10-CM | POA: Insufficient documentation

## 2011-12-03 DIAGNOSIS — Z09 Encounter for follow-up examination after completed treatment for conditions other than malignant neoplasm: Secondary | ICD-10-CM

## 2011-12-03 DIAGNOSIS — L089 Local infection of the skin and subcutaneous tissue, unspecified: Secondary | ICD-10-CM

## 2011-12-03 DIAGNOSIS — L988 Other specified disorders of the skin and subcutaneous tissue: Secondary | ICD-10-CM | POA: Insufficient documentation

## 2011-12-03 HISTORY — PX: MASS EXCISION: SHX2000

## 2011-12-03 SURGERY — EXCISION MASS
Anesthesia: General | Site: Abdomen | Wound class: Clean

## 2011-12-03 MED ORDER — 0.9 % SODIUM CHLORIDE (POUR BTL) OPTIME
TOPICAL | Status: DC | PRN
  Administered 2011-12-03: 1000 mL

## 2011-12-03 MED ORDER — ACETAMINOPHEN 650 MG RE SUPP
650.0000 mg | Freq: Four times a day (QID) | RECTAL | Status: DC | PRN
  Filled 2011-12-03: qty 1

## 2011-12-03 MED ORDER — MORPHINE SULFATE 2 MG/ML IJ SOLN: 2.0000 mg | INTRAMUSCULAR | Status: DC | PRN

## 2011-12-03 MED ORDER — FENTANYL CITRATE 0.05 MG/ML IJ SOLN: 25.0000 ug | INTRAMUSCULAR | Status: DC | PRN

## 2011-12-03 MED ORDER — PROPOFOL 10 MG/ML IV EMUL
INTRAVENOUS | Status: DC | PRN
  Administered 2011-12-03: 300 mg via INTRAVENOUS

## 2011-12-03 MED ORDER — DEXAMETHASONE SODIUM PHOSPHATE 10 MG/ML IJ SOLN
INTRAMUSCULAR | Status: DC | PRN
  Administered 2011-12-03: 10 mg via INTRAVENOUS

## 2011-12-03 MED ORDER — BACITRACIN ZINC 500 UNIT/GM EX OINT
TOPICAL_OINTMENT | CUTANEOUS | Status: DC | PRN
  Administered 2011-12-03: 1 via TOPICAL

## 2011-12-03 MED ORDER — FENTANYL CITRATE 0.05 MG/ML IJ SOLN
INTRAMUSCULAR | Status: DC | PRN
  Administered 2011-12-03 (×2): 50 ug via INTRAVENOUS

## 2011-12-03 MED ORDER — KETOROLAC TROMETHAMINE 30 MG/ML IJ SOLN: 15.0000 mg | Freq: Once | INTRAMUSCULAR | Status: DC | PRN

## 2011-12-03 MED ORDER — KETOROLAC TROMETHAMINE 15 MG/ML IJ SOLN
INTRAMUSCULAR | Status: AC
  Administered 2011-12-03: 15 mg
  Filled 2011-12-03: qty 1

## 2011-12-03 MED ORDER — LACTATED RINGERS IV SOLN
INTRAVENOUS | Status: DC
  Administered 2011-12-03: 09:00:00 via INTRAVENOUS
  Administered 2011-12-03: 1000 mL via INTRAVENOUS

## 2011-12-03 MED ORDER — BACITRACIN ZINC 500 UNIT/GM EX OINT
TOPICAL_OINTMENT | CUTANEOUS | Status: AC
  Filled 2011-12-03: qty 15

## 2011-12-03 MED ORDER — PROMETHAZINE HCL 25 MG/ML IJ SOLN: 6.2500 mg | INTRAMUSCULAR | Status: DC | PRN

## 2011-12-03 MED ORDER — CEFAZOLIN SODIUM-DEXTROSE 2-3 GM-% IV SOLR
2.0000 g | INTRAVENOUS | Status: AC
  Administered 2011-12-03: 2 g via INTRAVENOUS

## 2011-12-03 MED ORDER — LIDOCAINE HCL 1 % IJ SOLN
INTRAMUSCULAR | Status: DC | PRN
  Administered 2011-12-03: 60 mg via INTRADERMAL

## 2011-12-03 MED ORDER — ACETAMINOPHEN 325 MG PO TABS: 650.0000 mg | ORAL_TABLET | Freq: Four times a day (QID) | ORAL | Status: DC | PRN

## 2011-12-03 MED ORDER — ONDANSETRON HCL 4 MG/2ML IJ SOLN
INTRAMUSCULAR | Status: DC | PRN
  Administered 2011-12-03: 4 mg via INTRAVENOUS

## 2011-12-03 MED ORDER — OXYCODONE-ACETAMINOPHEN 5-325 MG PO TABS: 1.0000 | ORAL_TABLET | Freq: Four times a day (QID) | ORAL | Status: AC | PRN

## 2011-12-03 MED ORDER — MIDAZOLAM HCL 5 MG/5ML IJ SOLN
INTRAMUSCULAR | Status: DC | PRN
  Administered 2011-12-03: 2 mg via INTRAVENOUS

## 2011-12-03 MED ORDER — ONDANSETRON HCL 4 MG/2ML IJ SOLN: 4.0000 mg | Freq: Four times a day (QID) | INTRAMUSCULAR | Status: DC | PRN

## 2011-12-03 MED ORDER — CEFAZOLIN SODIUM-DEXTROSE 2-3 GM-% IV SOLR
INTRAVENOUS | Status: AC
  Filled 2011-12-03: qty 50

## 2011-12-03 MED ORDER — OXYCODONE HCL 5 MG PO TABS: 5.0000 mg | ORAL_TABLET | ORAL | Status: DC | PRN

## 2011-12-03 SURGICAL SUPPLY — 38 items
ADH SKN CLS APL DERMABOND .7 (GAUZE/BANDAGES/DRESSINGS) ×1
BLADE SURG 15 STRL LF DISP TIS (BLADE) ×1 IMPLANT
BLADE SURG 15 STRL SS (BLADE) ×2
BLADE SURG ROTATE 9660 (MISCELLANEOUS) IMPLANT
CANISTER SUCTION 1200CC (MISCELLANEOUS) ×1 IMPLANT
CHLORAPREP W/TINT 26ML (MISCELLANEOUS) ×2 IMPLANT
CLOTH BEACON ORANGE TIMEOUT ST (SAFETY) ×2 IMPLANT
COVER SURGICAL LIGHT HANDLE (MISCELLANEOUS) ×1 IMPLANT
DECANTER SPIKE VIAL GLASS SM (MISCELLANEOUS) IMPLANT
DERMABOND ADVANCED (GAUZE/BANDAGES/DRESSINGS) ×1
DERMABOND ADVANCED .7 DNX12 (GAUZE/BANDAGES/DRESSINGS) ×1 IMPLANT
DRAPE PED LAPAROTOMY (DRAPES) ×2 IMPLANT
DRSG TEGADERM 4X4.75 (GAUZE/BANDAGES/DRESSINGS) ×1 IMPLANT
ELECT REM PT RETURN 9FT ADLT (ELECTROSURGICAL) ×2
ELECTRODE REM PT RTRN 9FT ADLT (ELECTROSURGICAL) ×1 IMPLANT
GLOVE BIO SURGEON STRL SZ7 (GLOVE) ×2 IMPLANT
GLOVE BIOGEL PI IND STRL 7.5 (GLOVE) ×1 IMPLANT
GLOVE BIOGEL PI INDICATOR 7.5 (GLOVE) ×1
GOWN PREVENTION PLUS XLARGE (GOWN DISPOSABLE) ×1 IMPLANT
GOWN STRL NON-REIN LRG LVL3 (GOWN DISPOSABLE) ×2 IMPLANT
KIT BASIN OR (CUSTOM PROCEDURE TRAY) ×2 IMPLANT
NDL HYPO 25X1 1.5 SAFETY (NEEDLE) ×1 IMPLANT
NEEDLE HYPO 25X1 1.5 SAFETY (NEEDLE) IMPLANT
NS IRRIG 1000ML POUR BTL (IV SOLUTION) IMPLANT
PACK BASIC VI WITH GOWN DISP (CUSTOM PROCEDURE TRAY) ×1 IMPLANT
PENCIL BUTTON HOLSTER BLD 10FT (ELECTRODE) ×2 IMPLANT
SPONGE GAUZE 4X4 12PLY (GAUZE/BANDAGES/DRESSINGS) ×1 IMPLANT
SUT MNCRL AB 4-0 PS2 18 (SUTURE) ×2 IMPLANT
SUT VIC AB 2-0 CT2 27 (SUTURE) ×2 IMPLANT
SUT VIC AB 3-0 SH 27 (SUTURE) ×2
SUT VIC AB 3-0 SH 27X BRD (SUTURE) IMPLANT
SYR CONTROL 10ML LL (SYRINGE) ×1 IMPLANT
TOWEL OR 17X26 10 PK STRL BLUE (TOWEL DISPOSABLE) ×3 IMPLANT
TOWEL OR NON WOVEN STRL DISP B (DISPOSABLE) ×2 IMPLANT
TUBING CONNECTING 10 (TUBING) ×1 IMPLANT
WATER STERILE IRR 1000ML POUR (IV SOLUTION) ×1 IMPLANT
YANKAUER SUCT BULB TIP 10FT TU (MISCELLANEOUS) IMPLANT
YANKAUER SUCT BULB TIP NO VENT (SUCTIONS) IMPLANT

## 2011-12-03 NOTE — H&P (View-Only) (Signed)
Patient ID: Sharon Gallegos, female   DOB: 06/19/1966, 46 y.o.   MRN: [id number]  Chief Complaint  Patient presents with  . Follow-up    HPI Sharon Gallegos is a 46 y.o. female.   HPI This is a 46-year-old female who has a history of an appendectomy as a child as well as a laparoscopic cholecystectomy. The last surgery was done 3 years ago. She comes in today after she began on Thanksgiving having some yellowish drainage that is coming from near her umbilicus. This is causing some pain and as well as being associated with a little bit of bloody fluid also. She has no symptoms of any obstruction and no intra-abdominal symptoms I can figure out today. She has no fevers or any redness that she is noted around this. She was evaluated and underwent an ultrasound that shows a small complex periumbilical subcutaneous nodular lesion extending toward the skin surface. I sent her for ct scan to ensure there is no intra-abdominal connection and it appears this is all in abdominal wall.  She comes in today to review ct scan and to come up with a final plan.  There has been no change in her symptoms since the last visit.  History reviewed. No pertinent past medical history.  Past Surgical History  Procedure Date  . Appendectomy 1976/1977  . Cholecystectomy 2009  . Ankle surgery 1998    6 ankle surgeries     History reviewed. No pertinent family history.  Social History History  Substance Use Topics  . Smoking status: Never Smoker   . Smokeless tobacco: Never Used  . Alcohol Use: No    No Known Allergies  Current Outpatient Prescriptions  Medication Sig Dispense Refill  . levonorgestrel-ethinyl estradiol (AVIANE,ALESSE,LESSINA) 0.1-20 MG-MCG tablet Take 1 tablet by mouth daily.          Review of Systems Review of Systems  Blood pressure 128/88, pulse 70, temperature 97.8 F (36.6 C), temperature source Temporal, resp. rate 14, height 5' 3" (1.6 m), weight 276 lb 9.6 oz (125.465  kg).  Physical Exam Physical Exam  Constitutional: She appears well-developed and well-nourished.  Cardiovascular: Normal rate, regular rhythm and normal heart sounds.   Pulmonary/Chest: Effort normal. She has no wheezes. She has no rales.  Abdominal: Soft.      Data Reviewed CT ABDOMEN AND PELVIS WITH CONTRAST  Technique: Multidetector CT imaging of the abdomen and pelvis was  performed following the standard protocol during bolus  administration of intravenous contrast.  Contrast: 100mL OMNIPAQUE IOHEXOL 300 MG/ML IV SOLN  Comparison: None.  Findings: Intra-abdominal detail is mildly limited by body habitus.  The lung bases are clear. There is no biliary dilatation status  post cholecystectomy. The liver, spleen, pancreas, adrenal glands  and kidneys appear normal.  There is a 1 cm subcutaneous nodule deep to the umbilicus which  demonstrates no air within it. There is no surrounding  inflammatory change or intra-abdominal extension. No umbilical  hernia is evident. The bowel gas pattern is normal.  There is low density prominence of the left ovary which measures  4.1 x 2.9 cm on image 72. There is a dependent calcification (or  adjacent surgical clip based on the scout appearance) within the  left ovary on images 73 and 74. The right ovary and uterus appear  unremarkable. The bladder appears unremarkable. No urachal  remnant is evident. Mild facet degenerative changes of the lower  lumbar spine are noted.  IMPRESSION:  1. A small   subcutaneous nodule at the umbilicus may correspond  with the patient's palpable concern. No draining sinus, intra-  abdominal extension or associated hernia demonstrated.  2. Suspected small left ovarian cyst, possibly functional.   Assessment    Umbilical nodule with draining sinus    Plan    This area does appear to be subcutaneous in nature and may very well be likely related to her old scar that is fair. I discussed observation which I  did not recommend is I'm concerned that she might have a worsening infection in that area. I recommended excision of this subcutaneous nodule as well as a sinus that continues to drain. This may require removal of port over umbilicus and this may certainly look different upon completion. I discussed with her the risks including but not limited to recurrence, open wound that will heal by secondary intention, bleeding, and infection. She understands all he is risks and would like to proceed with surgery as soon as possible.      Johneric Mcfadden 11/17/2011, 10:17 AM    

## 2011-12-03 NOTE — Anesthesia Procedure Notes (Signed)
Procedure Name: LMA Insertion Date/Time: 12/03/2011 9:44 AM Performed by: [place], Gennifer Potenza C Pre-anesthesia Checklist: Patient identified, Emergency Drugs available, Timeout performed, Suction available and Patient being monitored Patient Re-evaluated:Patient Re-evaluated prior to inductionOxygen Delivery Method: Circle System Utilized Preoxygenation: Pre-oxygenation with 100% oxygen Intubation Type: IV induction LMA: LMA inserted and LMA with gastric port inserted LMA Size: 4.0 Number of attempts: 1 Placement Confirmation: positive ETCO2,  CO2 detector and breath sounds checked- equal and bilateral Tube secured with: Tape Dental Injury: Teeth and Oropharynx as per pre-operative assessment

## 2011-12-03 NOTE — Anesthesia Postprocedure Evaluation (Signed)
  Anesthesia Post-op Note  Patient: Sharon Gallegos  Procedure(s) Performed:  EXCISION MASS - excision umbilical nodule  Patient Location: PACU  Anesthesia Type: General  Level of Consciousness: awake and alert   Airway and Oxygen Therapy: Patient Spontanous Breathing  Post-op Pain: mild  Post-op Assessment: Post-op Vital signs reviewed, Patient's Cardiovascular Status Stable, Respiratory Function Stable, Patent Airway and No signs of Nausea or vomiting  Post-op Vital Signs: stable  Complications: No apparent anesthesia complications

## 2011-12-03 NOTE — Transfer of Care (Signed)
Immediate Anesthesia Transfer of Care Note  Patient: Sharon Gallegos  Procedure(s) Performed:  EXCISION MASS - excision umbilical nodule  Patient Location: PACU  Anesthesia Type: General  Level of Consciousness: awake and alert   Airway & Oxygen Therapy: Patient Spontanous Breathing and Patient connected to face mask oxygen  Post-op Assessment: Report given to PACU RN and Post -op Vital signs reviewed and stable  Post vital signs: Reviewed and stable Filed Vitals:   12/03/11 0752  BP: 156/97  Pulse: 93  Temp: 37.1 C  Resp: 22    Complications: No apparent anesthesia complications

## 2011-12-03 NOTE — Interval H&P Note (Signed)
History and Physical Interval Note:  12/03/2011 9:31 AM  Sharon Gallegos  has presented today for surgery, with the diagnosis of umbilical nodule  The various methods of treatment have been discussed with the patient and family. After consideration of risks, benefits and other options for treatment, the patient has consented to  Procedure(s): Excision of draining umbilical nodule as a surgical intervention .  The patients' history has been reviewed, patient examined, no change in status, stable for surgery.  I have reviewed the patients' chart and labs.  Questions were answered to the patient's satisfaction.     [name]

## 2011-12-03 NOTE — Anesthesia Preprocedure Evaluation (Addendum)
Anesthesia Evaluation  Patient identified by MRN, date of birth, ID band Patient awake    Reviewed: Allergy & Precautions, H&P , NPO status , Patient's Chart, lab work & pertinent test results  Airway Mallampati: III TM Distance: <3 FB Neck ROM: Full    Dental No notable dental hx.    Pulmonary neg pulmonary ROS,  clear to auscultation  Pulmonary exam normal       Cardiovascular neg cardio ROS Regular Normal    Neuro/Psych Negative Neurological ROS  Negative Psych ROS   GI/Hepatic negative GI ROS, Neg liver ROS,   Endo/Other  Morbid obesity  Renal/GU negative Renal ROS  Genitourinary negative   Musculoskeletal negative musculoskeletal ROS (+)   Abdominal   Peds negative pediatric ROS (+)  Hematology negative hematology ROS (+)   Anesthesia Other Findings   Reproductive/Obstetrics negative OB ROS                          Anesthesia Physical Anesthesia Plan  ASA: III  Anesthesia Plan: General   Post-op Pain Management:    Induction: Intravenous  Airway Management Planned: LMA and Oral ETT  Additional Equipment:   Intra-op Plan:   Post-operative Plan: Extubation in OR  Informed Consent: I have reviewed the patients History and Physical, chart, labs and discussed the procedure including the risks, benefits and alternatives for the proposed anesthesia with the patient or authorized representative who has indicated his/her understanding and acceptance.   Dental advisory given  Plan Discussed with: CRNA  Anesthesia Plan Comments:         Anesthesia Quick Evaluation

## 2011-12-03 NOTE — Op Note (Signed)
Preoperative diagnosis: Draining umbilical sinus Postoperative diagnosis: Draining umbilical sinus to the base of the umbilicus  Procedure: Excision of umbilical nodule, draining sinus, in the umbilicus with necrotic skin Surgeon: Dr. Harden Mo Anesthesia: Gen. Estimated blood loss: Minimal Drains: None Specimens: Umbilical sinus to pathology Sponge needle count was correct x2 at end of operation Disposition patient to recovery room in stable condition  Indications: This is a 46 year old female has a history of a laparoscopic cholecystectomy. She is at a draining sinus via her umbilicus for a number of months now. This was evident on exam I could not identify a source. I did send her for a CT scan to ensure there was no intra-abdominal source of which there was none. This all appeared to be related to her prior scar. I discussed with her taking her to the operating room and excising the sinus and debris this area.  Procedure: After informed consent was obtained the patient was taken to the operating room. She was initially 2 g of intravenous cefazolin. Sequential compression devices were placed on her legs prior to induction of anesthesia. She was in place under general anesthesia without complication. Her abdomen was then prepped and draped in the standard sterile  surgical fashion.surgical timeout was then performed.  I entered her prior vertical incision just on either side of her umbilicus. I then carried this down to the base of her umbilicus. It became clear that she had a draining sinus to the base of her umbilicus. There was a sinus as well as a cavity below this. There was no evidence of a hernia and this did not enter into her abdomen. I debrided this umbilical sinus and all the tissue around it. This went all the way to the base of the umbilicus and there was already a 4 mm hole at the base of her umbilicus. Once I did this I was able to turn her umbilicus on side out and there was  some dusky skin as well as a small portion of necrosis around this area had been draining. I don't think that this will heal unless this skin was excised. So I excised a portion of her umbilicus with cautery. This remaining tissue was all cleaned. There was no evidence of any infection during this procedure at all. I then closed this with 2-0 undyed Vicryl to try to tack her skin down to make her another umbilicus. I then closed the deep tissue 2-0 Vicryl. The dermis was closed with 3-0 Vicryl and the skin was closed with staples. Bacitracin and a sterile dressing were then placed. She was extubated and transferred to the recovery room in stable condition.

## 2011-12-06 ENCOUNTER — Encounter (HOSPITAL_COMMUNITY): Payer: Self-pay | Admitting: General Surgery

## 2011-12-06 ENCOUNTER — Telehealth (INDEPENDENT_AMBULATORY_CARE_PROVIDER_SITE_OTHER): Payer: Self-pay | Admitting: General Surgery

## 2011-12-06 NOTE — Telephone Encounter (Signed)
LMOM stating the pt's po appt with Dr Dwain Sarna for 12-13-11 @2 :15 for a 2:30.

## 2011-12-09 ENCOUNTER — Other Ambulatory Visit (HOSPITAL_COMMUNITY): Payer: Self-pay | Admitting: Family Medicine

## 2011-12-09 DIAGNOSIS — Z1231 Encounter for screening mammogram for malignant neoplasm of breast: Secondary | ICD-10-CM

## 2011-12-13 ENCOUNTER — Ambulatory Visit (INDEPENDENT_AMBULATORY_CARE_PROVIDER_SITE_OTHER): Payer: Self-pay | Admitting: General Surgery

## 2011-12-13 ENCOUNTER — Encounter (INDEPENDENT_AMBULATORY_CARE_PROVIDER_SITE_OTHER): Payer: Self-pay | Admitting: General Surgery

## 2011-12-13 VITALS — BP 128/80 | HR 74 | Temp 98.6°F | Resp 18 | Ht 63.0 in | Wt 271.2 lb

## 2011-12-13 DIAGNOSIS — Z09 Encounter for follow-up examination after completed treatment for conditions other than malignant neoplasm: Secondary | ICD-10-CM

## 2011-12-13 NOTE — Progress Notes (Signed)
Subjective:     Patient ID: Sharon Gallegos, female   DOB: [birth year], 46 y.o.   MRN: [id number]  HPI 6 yof who had a draining umbilical wound. I took to the operating room and excised a small nodule as well as the sinus that was associated with this. She has done well and returned today without any significant complaints. Her pathology was benign.  Review of Systems     Objective:   Physical Exam Healing midline wound without infection, staples in place    Assessment:     Excision of umbilical nodule and sinus    Plan:     I removed staples today and put steristerips on Return to work Monday with weight restrictions for 2 more weeks See back as needed Discussed path

## 2011-12-17 ENCOUNTER — Encounter (INDEPENDENT_AMBULATORY_CARE_PROVIDER_SITE_OTHER): Payer: Self-pay

## 2011-12-17 ENCOUNTER — Telehealth (INDEPENDENT_AMBULATORY_CARE_PROVIDER_SITE_OTHER): Payer: Self-pay

## 2011-12-17 NOTE — Telephone Encounter (Signed)
Pt calling b/c she was going to go back to work on 12-20-11 with restrictions but her job will not allow her to go back with restrictions. 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 I told her that I would get her a new letter to go back to work and fax it for her but she wants me to mail the letter to her. I put the letter in the mail today.

## 2012-01-06 ENCOUNTER — Ambulatory Visit (HOSPITAL_COMMUNITY)
Admission: RE | Admit: 2012-01-06 | Discharge: 2012-01-06 | Disposition: A | Discharge status: Home / Self Care | Payer: Self-pay | Source: Ambulatory Visit | Attending: Family Medicine | Admitting: Family Medicine

## 2012-01-06 DIAGNOSIS — Z1231 Encounter for screening mammogram for malignant neoplasm of breast: Secondary | ICD-10-CM

## 2012-01-11 ENCOUNTER — Ambulatory Visit (INDEPENDENT_AMBULATORY_CARE_PROVIDER_SITE_OTHER): Payer: Self-pay | Admitting: Surgery

## 2012-01-11 ENCOUNTER — Encounter (INDEPENDENT_AMBULATORY_CARE_PROVIDER_SITE_OTHER): Payer: Self-pay | Admitting: Surgery

## 2012-01-11 VITALS — BP 130/82 | HR 89 | Temp 98.3°F

## 2012-01-11 DIAGNOSIS — Z9889 Other specified postprocedural states: Secondary | ICD-10-CM

## 2012-01-11 NOTE — Progress Notes (Signed)
Pt presents for wound check.  Pt noticed some redness.   Wound clean dry intact.  No infection.  No redness.  Return as needed.

## 2012-01-11 NOTE — Patient Instructions (Signed)
Return as needed

## 2013-01-01 ENCOUNTER — Other Ambulatory Visit (HOSPITAL_COMMUNITY): Payer: Self-pay | Admitting: Family Medicine

## 2013-01-01 DIAGNOSIS — Z1231 Encounter for screening mammogram for malignant neoplasm of breast: Secondary | ICD-10-CM

## 2013-01-11 IMAGING — MG MM DIGITAL SCREENING BILAT
5 series · 5 of 5 positions shown · non-contrast
Comparison: none

DG SCHOLARSHIP MAMMOGRAM
Bilateral CC and MLO view(s) were taken.
Technologist: Bernhard, Darrius.(ALMITA)(M)

DIGITAL SCREENING MAMMOGRAM WITH CAD:
The breast tissue is almost entirely fatty.  No masses or malignant type calcifications are 
identified.  Compared with prior studies.
Images were processed with CAD.

[R CC (1 of 2)]
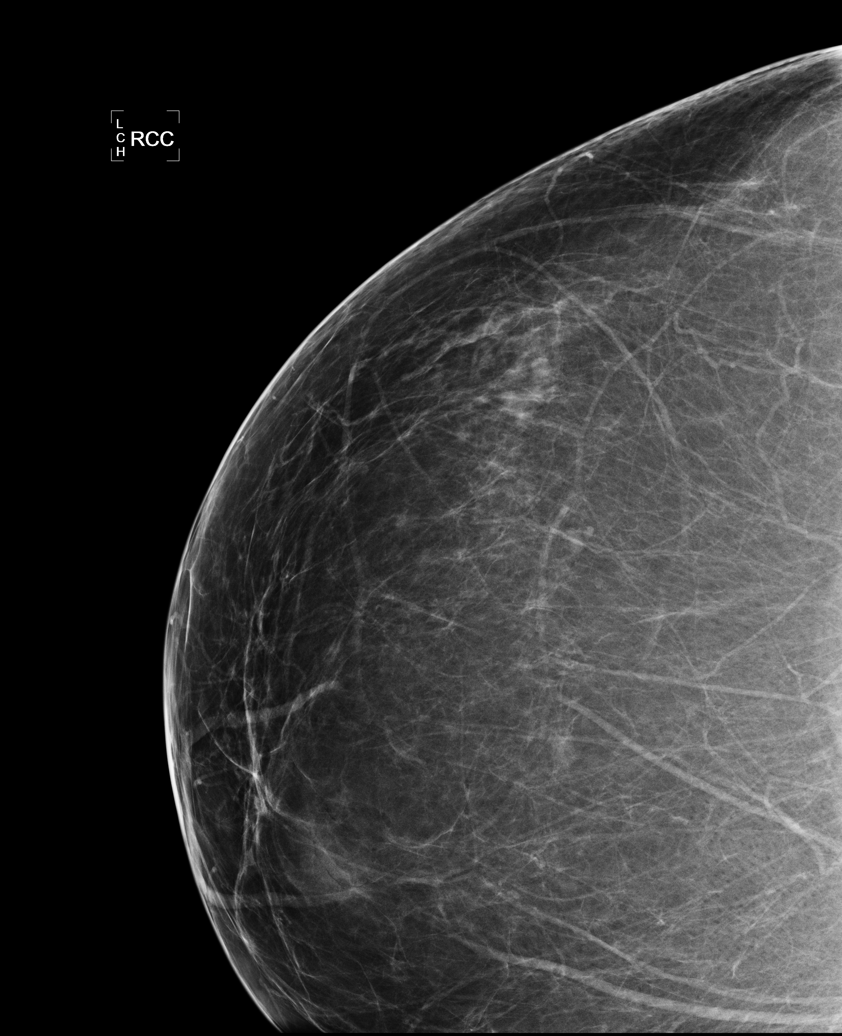

[L CC]
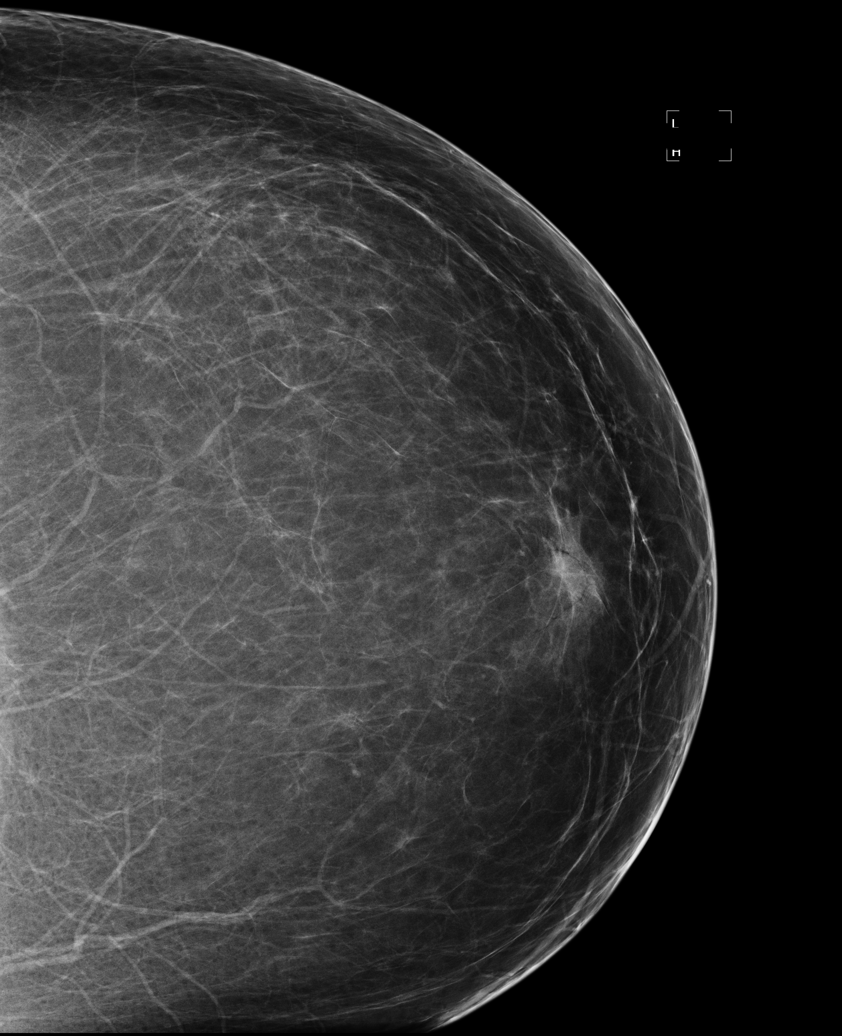

[R CC (2 of 2)]
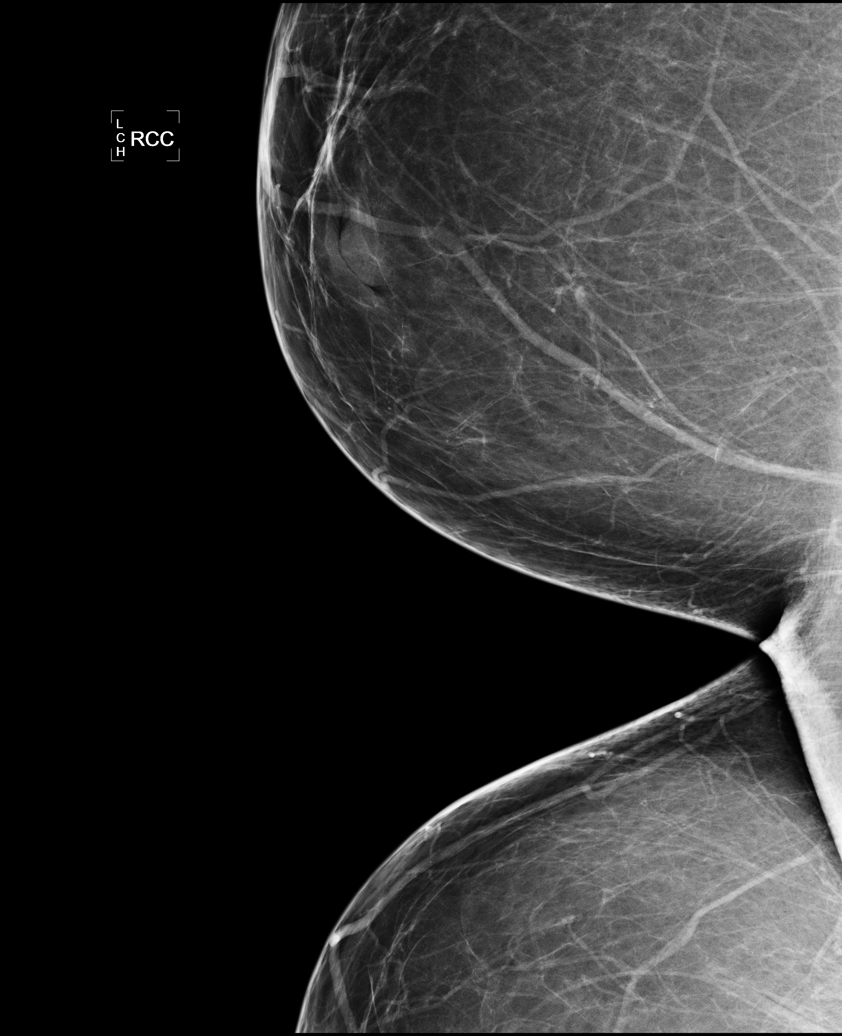

[L MLO]
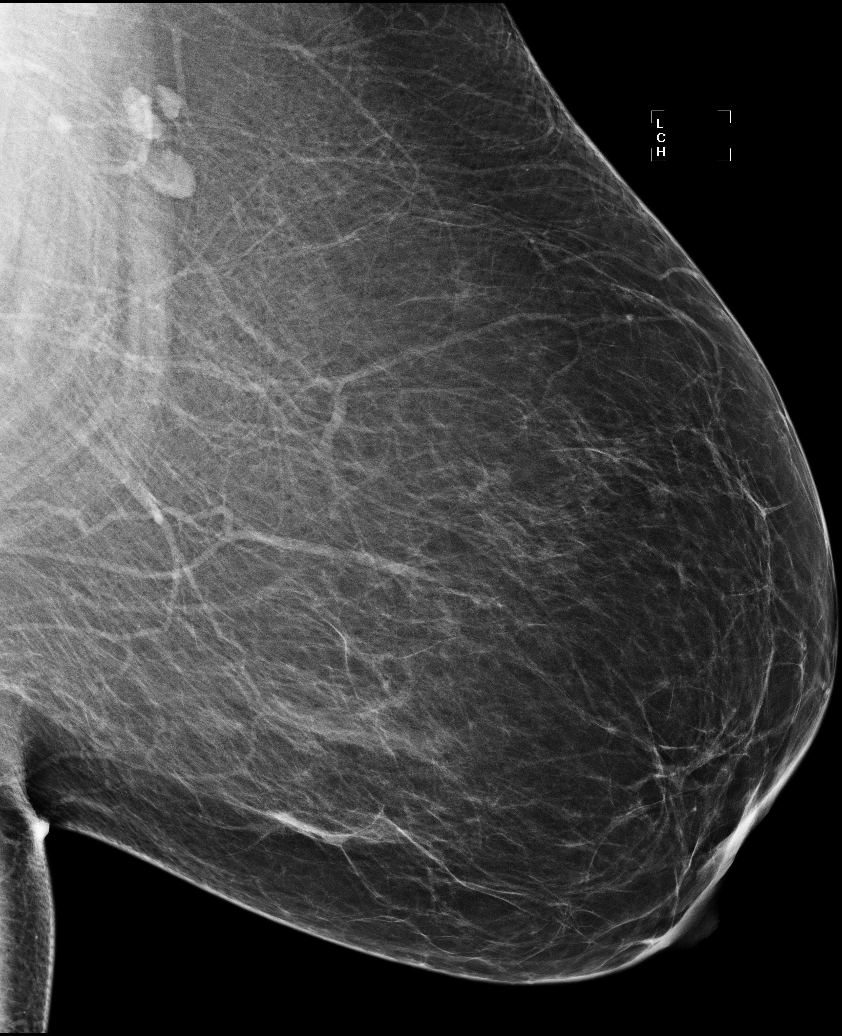

[R MLO]
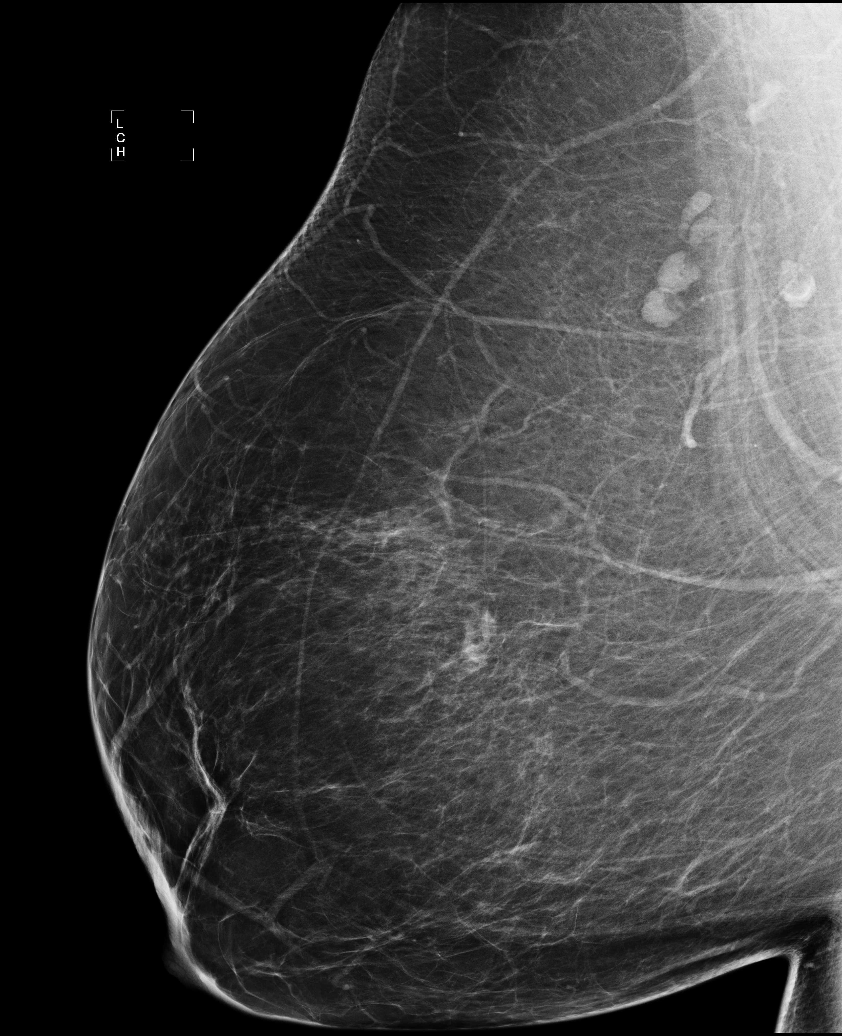

[5 of 5 positions shown; findings below may reference images not displayed]

IMPRESSION: No specific mammographic evidence of malignancy.  Next screening mammogram is recommended in one 
year.

A result letter of this screening mammogram will be mailed directly to the patient.

ASSESSMENT: Negative - BI-RADS 1

Screening mammogram in 1 year.
,

## 2013-01-12 ENCOUNTER — Ambulatory Visit (HOSPITAL_COMMUNITY): Payer: Self-pay

## 2013-09-09 ENCOUNTER — Emergency Department (INDEPENDENT_AMBULATORY_CARE_PROVIDER_SITE_OTHER)
Admission: EM | Admit: 2013-09-09 | Discharge: 2013-09-09 | Disposition: A | Discharge status: Home / Self Care | Payer: No Typology Code available for payment source | Source: Home / Self Care | Attending: Family Medicine | Admitting: Family Medicine

## 2013-09-09 ENCOUNTER — Encounter (HOSPITAL_COMMUNITY): Payer: Self-pay | Admitting: Emergency Medicine

## 2013-09-09 DIAGNOSIS — N390 Urinary tract infection, site not specified: Secondary | ICD-10-CM

## 2013-09-09 HISTORY — DX: Hyperlipidemia, unspecified: E78.5

## 2013-09-09 LAB — POCT URINALYSIS DIP (DEVICE)
Glucose, UA: NEGATIVE mg/dL
Hgb urine dipstick: NEGATIVE
Ketones, ur: NEGATIVE mg/dL
Specific Gravity, Urine: >=1.03 (ref 1.005–1.030)
Urobilinogen, UA: 0.2 mg/dL (ref 0.0–1.0)

## 2013-09-09 LAB — POCT PREGNANCY, URINE: Preg Test, Ur: NEGATIVE

## 2013-09-09 MED ORDER — CEPHALEXIN 500 MG PO CAPS: 500.0000 mg | ORAL_CAPSULE | Freq: Three times a day (TID) | ORAL | Status: DC

## 2013-09-09 NOTE — ED Notes (Signed)
Assessment per Dr. Corey. 

## 2013-09-09 NOTE — ED Provider Notes (Signed)
Sharon Gallegos is a 47 y.o. female who presents to Urgent Care today for urinary frequency urgency and burning and pain with urination starting this morning. No significant vaginal discharge abdominal pain nausea vomiting or diarrhea. She has not tried any medications yet. She feels well otherwise. No blood in the urine.   No past medical history on file. History  Substance Use Topics  . Smoking status: Never Smoker   . Smokeless tobacco: Never Used  . Alcohol Use: No   ROS as above Medications reviewed. No current facility-administered medications for this encounter.   Current Outpatient Prescriptions  Medication Sig Dispense Refill  . PRAVASTATIN SODIUM PO Take by mouth.      Marland Kitchen UNKNOWN TO PATIENT BCPs      . cephALEXin (KEFLEX) 500 MG capsule Take 1 capsule (500 mg total) by mouth 3 (three) times daily.  21 capsule  0  . ibuprofen (ADVIL,MOTRIN) 200 MG tablet Take 400 mg by mouth every 6 (six) hours as needed. For pain       . levonorgestrel-ethinyl estradiol (AVIANE,ALESSE,LESSINA) 0.1-20 MG-MCG tablet Take 1 tablet by mouth daily.          Exam:  BP 170/80  Pulse 74  Temp(Src) 99.7 F (37.6 C) (Oral)  Resp 18  SpO2 100% Gen: Well NAD, obese HEENT: EOMI,  MMM Lungs: CTABL Nl WOB Heart: RRR no MRG Abd: NABS, NT, ND, no CV angle tenderness to percussion Exts: Non edematous BL  LE, warm and well perfused.   Results for orders placed during the hospital encounter of 09/09/13 (from the past 24 hour(s))  POCT URINALYSIS DIP (DEVICE)     Status: Abnormal   Collection Time    09/09/13 10:32 AM      Result Value Range   Glucose, UA NEGATIVE  NEGATIVE mg/dL   Bilirubin Urine NEGATIVE  NEGATIVE   Ketones, ur NEGATIVE  NEGATIVE mg/dL   Specific Gravity, Urine >=1.030  1.005 - 1.030   Hgb urine dipstick NEGATIVE  NEGATIVE   pH 6.0  5.0 - 8.0   Protein, ur NEGATIVE  NEGATIVE mg/dL   Urobilinogen, UA 0.2  0.0 - 1.0 mg/dL   Nitrite NEGATIVE  NEGATIVE   Leukocytes, UA TRACE  (*) NEGATIVE  POCT PREGNANCY, URINE     Status: None   Collection Time    09/09/13 10:36 AM      Result Value Range   Preg Test, Ur NEGATIVE  NEGATIVE   No results found.  Assessment and Plan: 47 y.o. female with urinary tract infection. Urine culture is pending. Plan to treat empirically with Keflex 3 times daily for one week. Followup with primary care provider. Discussed warning signs or symptoms. Please see discharge instructions. Patient expresses understanding.      Rodolph Bong, MD 09/09/13 1047

## 2013-09-10 LAB — URINE CULTURE: Special Requests: NORMAL

## 2013-12-17 ENCOUNTER — Ambulatory Visit (HOSPITAL_COMMUNITY): Payer: Self-pay

## 2014-05-03 ENCOUNTER — Other Ambulatory Visit (HOSPITAL_COMMUNITY): Payer: Self-pay | Admitting: Family Medicine

## 2014-08-20 ENCOUNTER — Other Ambulatory Visit (HOSPITAL_COMMUNITY): Payer: Self-pay | Admitting: Family Medicine

## 2014-08-20 DIAGNOSIS — Z1231 Encounter for screening mammogram for malignant neoplasm of breast: Secondary | ICD-10-CM

## 2014-09-18 ENCOUNTER — Emergency Department (HOSPITAL_COMMUNITY)
Admission: EM | Admit: 2014-09-18 | Discharge: 2014-09-18 | Disposition: A | Discharge status: Home / Self Care | Payer: Self-pay | Attending: Emergency Medicine | Admitting: Emergency Medicine

## 2014-09-18 ENCOUNTER — Encounter (HOSPITAL_COMMUNITY): Payer: Self-pay | Admitting: *Deleted

## 2014-09-18 ENCOUNTER — Emergency Department (HOSPITAL_COMMUNITY): Payer: Self-pay

## 2014-09-18 DIAGNOSIS — Z79899 Other long term (current) drug therapy: Secondary | ICD-10-CM | POA: Insufficient documentation

## 2014-09-18 DIAGNOSIS — Z8639 Personal history of other endocrine, nutritional and metabolic disease: Secondary | ICD-10-CM | POA: Insufficient documentation

## 2014-09-18 DIAGNOSIS — R0789 Other chest pain: Secondary | ICD-10-CM | POA: Insufficient documentation

## 2014-09-18 DIAGNOSIS — Z792 Long term (current) use of antibiotics: Secondary | ICD-10-CM | POA: Insufficient documentation

## 2014-09-18 DIAGNOSIS — R079 Chest pain, unspecified: Secondary | ICD-10-CM

## 2014-09-18 LAB — CBC WITH DIFFERENTIAL/PLATELET
Basophils Absolute: 0 10*3/uL (ref 0.0–0.1)
Basophils Relative: 1 % (ref 0–1)
Eosinophils Absolute: 0.1 10*3/uL (ref 0.0–0.7)
Eosinophils Relative: 1 % (ref 0–5)
HEMATOCRIT: 38.2 % (ref 36.0–46.0)
HEMOGLOBIN: 12.4 g/dL (ref 12.0–15.0)
LYMPHS PCT: 30 % (ref 12–46)
Lymphs Abs: 2.5 10*3/uL (ref 0.7–4.0)
MCH: 28.4 pg (ref 26.0–34.0)
MCHC: 32.5 g/dL (ref 30.0–36.0)
MCV: 87.6 fL (ref 78.0–100.0)
MONO ABS: 0.5 10*3/uL (ref 0.1–1.0)
MONOS PCT: 6 % (ref 3–12)
NEUTROS ABS: 5.1 10*3/uL (ref 1.7–7.7)
Neutrophils Relative %: 62 % (ref 43–77)
Platelets: 313 10*3/uL (ref 150–400)
RBC: 4.36 MIL/uL (ref 3.87–5.11)
RDW: 13 % (ref 11.5–15.5)
WBC: 8.2 10*3/uL (ref 4.0–10.5)

## 2014-09-18 LAB — BASIC METABOLIC PANEL
ANION GAP: 14 (ref 5–15)
BUN: 9 mg/dL (ref 6–23)
CHLORIDE: 101 meq/L (ref 96–112)
CO2: 24 meq/L (ref 19–32)
Calcium: 9.2 mg/dL (ref 8.4–10.5)
Creatinine, Ser: 0.67 mg/dL (ref 0.50–1.10)
GFR calc Af Amer: >90 mL/min (ref >90)
GFR calc non Af Amer: >90 mL/min (ref >90)
Glucose, Bld: 103 mg/dL — ABNORMAL HIGH (ref 70–99)
POTASSIUM: 3.9 meq/L (ref 3.7–5.3)
SODIUM: 139 meq/L (ref 137–147)

## 2014-09-18 LAB — TROPONIN I: Troponin I: <0.3 ng/mL (ref <0.30)

## 2014-09-18 MED ORDER — NAPROXEN 500 MG PO TABS: 500.0000 mg | ORAL_TABLET | Freq: Two times a day (BID) | ORAL | Status: AC

## 2014-09-18 NOTE — ED Notes (Signed)
The patient went to X-ray.

## 2014-09-18 NOTE — ED Notes (Signed)
Pt reports having left side chest pains this am, describes as intermittent and sharp pains. Denies recent cough. Airway intact and no acute distress noted at triage.

## 2014-09-18 NOTE — Discharge Instructions (Signed)
Chest Pain (Nonspecific) It is often hard to give a diagnosis for the cause of chest pain. There is always a chance that your pain could be related to something serious, such as a heart attack or a blood clot in the lungs. You need to follow up with your doctor. HOME CARE  If antibiotic medicine was given, take it as directed by your doctor. Finish the medicine even if you start to feel better.  For the next few days, avoid activities that bring on chest pain. Continue physical activities as told by your doctor.  Do not use any tobacco products. This includes cigarettes, chewing tobacco, and e-cigarettes.  Avoid drinking alcohol.  Only take medicine as told by your doctor.  Follow your doctor's suggestions for more testing if your chest pain does not go away.  Keep all doctor visits you made. GET HELP IF:  Your chest pain does not go away, even after treatment.  You have a rash with blisters on your chest.  You have a fever. GET HELP RIGHT AWAY IF:   You have more pain or pain that spreads to your arm, neck, jaw, back, or belly (abdomen).  You have shortness of breath.  You cough more than usual or cough up blood.  You have very bad back or belly pain.  You feel sick to your stomach (nauseous) or throw up (vomit).  You have very bad weakness.  You pass out (faint).  You have chills. This is an emergency. Do not wait to see if the problems will go away. Call your local emergency services (911 in [place] Do not drive yourself to the hospital. MAKE SURE YOU:   Understand these instructions.  Will watch your condition.  Will get help right away if you are not doing well or get worse. Document Released: 04/19/2008 Document Revised: 11/06/2013 Document Reviewed: 04/19/2008 [facility] Patient Information 2015 [employer] [place]. This information is not intended to replace advice given to you by your health care provider. Make sure you discuss any questions you have with your  health care provider.   Tests were normal. Medication for pain. Follow-up with your primary care doctor or return if worse.

## 2014-09-18 NOTE — ED Provider Notes (Signed)
CSN: [id number]     Arrival date & time 09/18/14  0909 History   First MD Initiated Contact with Patient 09/18/14 [phone]     Chief Complaint  Patient presents with  . Chest Pain     (Consider location/radiation/quality/duration/timing/severity/associated sxs/prior Treatment) HPI .Marland Kitchen....intermittent dull mid chest pain since early this morning, each episode lasting 3-5 minutes. No dyspnea, diaphoresis, nausea. No associated with any activity. No radiation. Past medical history positive for morbid obesity. No cigarette smoking. No family history of early myocardial infarction. She is not having pain at present. Past Medical History  Diagnosis Date  . Hyperlipemia    Past Surgical History  Procedure Laterality Date  . Appendectomy  1976/1977  . Cholecystectomy  2009  . Ankle surgery  1998    6 ankle surgeries   . Lysis of adhesions    . Mass excision  12/03/2011    Procedure: EXCISION MASS;  Surgeon: Emelia LoronMatthew Wakefield, MD;  Location: [facility];  Service: General;  Laterality: N/A;  excision umbilical nodule  . Hernia repair  11/17/2011   History reviewed. No pertinent family history. History  Substance Use Topics  . Smoking status: Never Smoker   . Smokeless tobacco: Never Used  . Alcohol Use: No   OB History    No data available     Review of Systems  All other systems reviewed and are negative.     Allergies  Butamben-tetracaine-benzocaine and Oxycodone  Home Medications   Prior to Admission medications   Medication Sig Start Date End Date Taking? Authorizing Provider  levonorgestrel-ethinyl estradiol (AVIANE,ALESSE,LESSINA) 0.1-20 MG-MCG tablet Take 1 tablet by mouth daily.     Yes Historical Provider, MD  pravastatin (PRAVACHOL) 10 MG tablet Take 10 mg by mouth daily. 08/13/14  Yes Historical Provider, MD  cephALEXin (KEFLEX) 500 MG capsule Take 1 capsule (500 mg total) by mouth 3 (three) times daily. Patient not taking: Reported on 09/18/2014 09/09/13   Rodolph BongEvan S Corey, MD   ibuprofen (ADVIL,MOTRIN) 200 MG tablet Take 400 mg by mouth every 6 (six) hours as needed. For pain     Historical Provider, MD  naproxen (NAPROSYN) 500 MG tablet Take 1 tablet (500 mg total) by mouth 2 (two) times daily. 09/18/14   Donnetta HutchingBrian Noble Cicalese, MD  PRAVASTATIN SODIUM PO Take by mouth.    Historical Provider, MD  UNKNOWN TO PATIENT BCPs    Historical Provider, MD   BP 154/87 mmHg  Pulse 62  Temp(Src) 98 F (36.7 C) (Oral)  Resp 16  Ht 5\' 3"  (1.6 m)  Wt 279 lb 6.4 oz (126.735 kg)  BMI 49.51 kg/m2  SpO2 97% Physical Exam  Constitutional: She is oriented to person, place, and time. She appears well-developed and well-nourished.  HENT:  Head: Normocephalic and atraumatic.  Eyes: Conjunctivae and EOM are normal. Pupils are equal, round, and reactive to light.  Neck: Normal range of motion. Neck supple.  Cardiovascular: Normal rate, regular rhythm and normal heart sounds.   Pulmonary/Chest: Effort normal and breath sounds normal.  Minimal anterior chest wall tenderness  Abdominal: Soft. Bowel sounds are normal.  Musculoskeletal: Normal range of motion.  Neurological: She is alert and oriented to person, place, and time.  Skin: Skin is warm and dry.  Psychiatric: She has a normal mood and affect. Her behavior is normal.  Nursing note and vitals reviewed.   ED Course  Procedures (including critical care time) Labs Review Labs Reviewed  BASIC METABOLIC PANEL - Abnormal; Notable for the following:  Glucose, Bld 103 (*)    All other components within normal limits  CBC WITH DIFFERENTIAL  TROPONIN I    Imaging Review Dg Chest 2 View  09/18/2014   CLINICAL DATA:  Mid chest pain.  EXAM: CHEST  2 VIEW  COMPARISON:  01/17/2008.  FINDINGS: The heart size and mediastinal contours are within normal limits. Both lungs are clear. The visualized skeletal structures are unremarkable.  IMPRESSION: No active cardiopulmonary disease.   Electronically Signed   By: Charlett NoseKevin  Dover M.D.   On:  09/18/2014 10:28     EKG Interpretation   Date/Time:  Wednesday September 18 2014 09:13:48 EST Ventricular Rate:  67 PR Interval:  136 QRS Duration: 94 QT Interval:  394 QTC Calculation: 416 R Axis:   1 Text Interpretation:  Normal sinus rhythm Inferior infarct , age  undetermined Cannot rule out Anterior infarct , age undetermined Abnormal  ECG Confirmed by Candy Ziegler  MD, Damaria Vachon ([id number]) on 09/18/2014 9:59:27 AM      MDM   Final diagnoses:  Chest pain    History and risk factor profile not suggestive of acute coronary syndrome or pulmonary embolism. Screening labs, EKG, chest x-ray, troponin are all negative. She has primary care follow-up. Discharge medications Naprosyn 500 mg    Donnetta HutchingBrian Linsey Arteaga, MD 09/18/14 1223

## 2014-09-25 ENCOUNTER — Ambulatory Visit (HOSPITAL_COMMUNITY)
Admission: RE | Admit: 2014-09-25 | Discharge: 2014-09-25 | Disposition: A | Discharge status: Home / Self Care | Payer: Self-pay | Source: Ambulatory Visit | Attending: Family Medicine | Admitting: Family Medicine

## 2014-09-25 DIAGNOSIS — Z1231 Encounter for screening mammogram for malignant neoplasm of breast: Secondary | ICD-10-CM

## 2015-09-24 ENCOUNTER — Other Ambulatory Visit: Payer: Self-pay | Admitting: Nurse Practitioner

## 2015-09-24 ENCOUNTER — Other Ambulatory Visit: Payer: Self-pay | Admitting: Family Medicine

## 2015-09-24 DIAGNOSIS — Z1231 Encounter for screening mammogram for malignant neoplasm of breast: Secondary | ICD-10-CM

## 2015-12-10 ENCOUNTER — Other Ambulatory Visit: Payer: Self-pay | Admitting: Obstetrics and Gynecology

## 2015-12-10 ENCOUNTER — Ambulatory Visit
Admission: RE | Admit: 2015-12-10 | Discharge: 2015-12-10 | Disposition: A | Discharge status: Home / Self Care | Payer: No Typology Code available for payment source | Source: Ambulatory Visit | Attending: Nurse Practitioner | Admitting: Nurse Practitioner

## 2015-12-10 DIAGNOSIS — Z1231 Encounter for screening mammogram for malignant neoplasm of breast: Secondary | ICD-10-CM

## 2017-10-17 ENCOUNTER — Other Ambulatory Visit: Payer: Self-pay | Admitting: Physician Assistant

## 2017-10-17 DIAGNOSIS — Z1231 Encounter for screening mammogram for malignant neoplasm of breast: Secondary | ICD-10-CM

## 2017-12-28 ENCOUNTER — Ambulatory Visit: Payer: No Typology Code available for payment source

## 2017-12-28 ENCOUNTER — Other Ambulatory Visit: Payer: Self-pay | Admitting: Physician Assistant

## 2017-12-28 DIAGNOSIS — Z1231 Encounter for screening mammogram for malignant neoplasm of breast: Secondary | ICD-10-CM

## 2018-04-17 ENCOUNTER — Ambulatory Visit: Payer: No Typology Code available for payment source

## 2018-05-11 ENCOUNTER — Ambulatory Visit: Payer: No Typology Code available for payment source

## 2018-05-31 ENCOUNTER — Ambulatory Visit: Payer: No Typology Code available for payment source

## 2018-09-15 ENCOUNTER — Ambulatory Visit (HOSPITAL_COMMUNITY)
Admission: EM | Admit: 2018-09-15 | Discharge: 2018-09-15 | Disposition: A | Discharge status: Home / Self Care | Payer: Self-pay | Attending: Family Medicine | Admitting: Family Medicine

## 2018-09-15 ENCOUNTER — Ambulatory Visit (INDEPENDENT_AMBULATORY_CARE_PROVIDER_SITE_OTHER): Payer: Self-pay

## 2018-09-15 ENCOUNTER — Other Ambulatory Visit: Payer: Self-pay

## 2018-09-15 ENCOUNTER — Encounter (HOSPITAL_COMMUNITY): Payer: Self-pay | Admitting: Emergency Medicine

## 2018-09-15 DIAGNOSIS — M25562 Pain in left knee: Secondary | ICD-10-CM

## 2018-09-15 DIAGNOSIS — M1712 Unilateral primary osteoarthritis, left knee: Secondary | ICD-10-CM

## 2018-09-15 MED ORDER — METHYLPREDNISOLONE 4 MG PO TBPK: ORAL_TABLET | ORAL | 0 refills | Status: AC

## 2018-09-15 NOTE — ED Provider Notes (Signed)
[facility]    CSN: [id number] Arrival date & time: 09/15/18  1415     History   Chief Complaint Chief Complaint  Patient presents with  . Knee Pain    left    HPI Sharon Gallegos is a 52 y.o. female.   HPI  She is here for left knee pain.  She states she is never specifically had trouble with her left knee before.  She states is been going on for 3 weeks.  Is been getting progressively worse.  It hurts with weightbearing.  No popping or clicking.  No buckling or locking.  No falls.  Hurts around the inside of her joint.  Hurts behind her joint.  Sometimes it looks swollen.  It is worse when she is been on her feet all day.  Is been present every day.  She is never had any x-rays in the past.  Past Medical History:  Diagnosis Date  . Hyperlipemia     There are no active problems to display for this patient.   Past Surgical History:  Procedure Laterality Date  . ANKLE SURGERY  1998   6 ankle surgeries   . APPENDECTOMY  1976/1977  . CHOLECYSTECTOMY  2009  . HERNIA REPAIR  11/17/2011  . lysis of adhesions    . MASS EXCISION  12/03/2011   Procedure: EXCISION MASS;  Surgeon: Emelia Loron, MD;  Location: [facility];  Service: General;  Laterality: N/A;  excision umbilical nodule    OB History   None      Home Medications    Prior to Admission medications   Medication Sig Start Date End Date Taking? Authorizing Provider  hydrochlorothiazide (HYDRODIURIL) 25 MG tablet hydrochlorothiazide 25 mg tablet 07/24/18  Yes [provider]  levothyroxine (SYNTHROID, LEVOTHROID) 50 MCG tablet levothyroxine 50 mcg tablet 06/26/18  Yes [provider]  naproxen (NAPROSYN) 500 MG tablet Take 1 tablet (500 mg total) by mouth 2 (two) times daily. 09/18/14  Yes Donnetta Hutching, MD  traMADol (ULTRAM) 50 MG tablet tramadol 50 mg tablet   Yes [provider]  ibuprofen (ADVIL,MOTRIN) 200 MG tablet Take 400 mg by mouth every 6 (six) hours as needed. For  pain     [provider]  methylPREDNISolone (MEDROL DOSEPAK) 4 MG TBPK tablet tad 09/15/18   Eustace Moore, MD  UNKNOWN TO PATIENT BCPs    [provider]    Family History History reviewed. No pertinent family history.  Social History Social History   Tobacco Use  . Smoking status: Never Smoker  . Smokeless tobacco: Never Used  Substance Use Topics  . Alcohol use: No  . Drug use: No     Allergies   Butamben-tetracaine-benzocaine and Oxycodone   Review of Systems Review of Systems  Constitutional: Negative for chills and fever.  HENT: Negative for ear pain and sore throat.   Eyes: Negative for pain and visual disturbance.  Respiratory: Negative for cough and shortness of breath.   Cardiovascular: Negative for chest pain and palpitations.  Gastrointestinal: Negative for abdominal pain and vomiting.  Genitourinary: Negative for dysuria and hematuria.  Musculoskeletal: Positive for arthralgias and gait problem. Negative for back pain.  Skin: Negative for color change and rash.  Neurological: Negative for seizures and syncope.  All other systems reviewed and are negative.    Physical Exam Triage Vital Signs ED Triage Vitals  Enc Vitals Group     BP 09/15/18 1507 (!) 153/79     Pulse  Rate 09/15/18 1507 89     Resp --      Temp 09/15/18 1507 98.3 F (36.8 C)     Temp Source 09/15/18 1507 Oral     SpO2 09/15/18 1507 98 %     Weight --      Height --      Head Circumference --      Peak Flow --      Pain Score 09/15/18 1508 2     Pain Loc --      Pain Edu? --      Excl. in GC? --    No data found.  Updated Vital Signs BP (!) 153/79 (BP Location: Left Wrist)   Pulse 89   Temp 98.3 F (36.8 C) (Oral)   SpO2 98%   Visual Acuity Right Eye Distance:   Left Eye Distance:   Bilateral Distance:    Right Eye Near:   Left Eye Near:    Bilateral Near:     Physical Exam  Constitutional: She appears well-developed and well-nourished.    Morbid obesity  Musculoskeletal:  Unable to get on exam table.  Examined in the room chair.  Limited flexion.  Full extension.  Valgus deformity.  No crepitus.  No patellar tenderness.  Tenderness is around the medial joint line.  Slight warmth.  No effusion.  No instability.  Examination is limited by body habitus and inability to do onto exam table.     UC Treatments / Results  Labs (all labs ordered are listed, but only abnormal results are displayed) Labs Reviewed - No data to display  EKG None  Radiology Dg Knee Ap/lat W/sunrise Left  Result Date: 09/15/2018 CLINICAL DATA:  Knee pain for the past month. EXAM: LEFT KNEE 3 VIEWS COMPARISON:  None. FINDINGS: No acute fracture or dislocation. Limited evaluation for joint effusion due to suboptimal positioning on the lateral view. Small tricompartmental osteophytes with relative preservation of the joint spaces. Soft tissues are unremarkable. IMPRESSION: 1.  No acute osseous abnormality. 2. Mild tricompartmental degenerative changes. Electronically Signed   By: Obie Dredge M.D.   On: 09/15/2018 16:22    Procedures Procedures (including critical care time)  Medications Ordered in UC Medications - No data to display  Initial Impression / Assessment and Plan / UC Course  I have reviewed the triage vital signs and the nursing notes.  Pertinent labs & imaging results that were available during my care of the patient were reviewed by me and considered in my medical decision making (see chart for details).    I explained to the patient that she has medial compartment narrowing consistent with early osteoarthritis.  This is consistent with her pain and her physical exam findings.  We discussed conservative management of knee pain.  Anti-inflammatories.  Exercises.  Weight reduction.  Joint protection.  Final Clinical Impressions(s) / UC Diagnoses   Final diagnoses:  Acute pain of left knee  Primary osteoarthritis of left knee      Discharge Instructions     Ice to knees  Rest over the weekend Take the medrol pak as instructed Take all of day one today (3 now, 3 later) After medrol (steroid) take aleve or ibuprofen for pain Do knee exercises Follow up with y our PCP    ED Prescriptions    Medication Sig Dispense Auth. Provider   methylPREDNISolone (MEDROL DOSEPAK) 4 MG TBPK tablet tad 21 tablet Eustace Moore, MD     Controlled Substance Prescriptions [place] Controlled  Substance Registry consulted? Not Applicable   Eustace Moore, MD 09/15/18 1911

## 2018-09-15 NOTE — ED Triage Notes (Signed)
Pt reports riding in the back of a car one month ago and feeling left knee pain that she thought was a cramp. She states she has been having anterior and posterior knee pain since that time.

## 2018-09-15 NOTE — Discharge Instructions (Addendum)
Ice to knees  Rest over the weekend Take the medrol pak as instructed Take all of day one today (3 now, 3 later) After medrol (steroid) take aleve or ibuprofen for pain Do knee exercises Follow up with y our PCP

## 2018-09-18 ENCOUNTER — Other Ambulatory Visit (HOSPITAL_COMMUNITY): Payer: Self-pay | Admitting: *Deleted

## 2018-09-18 DIAGNOSIS — Z1231 Encounter for screening mammogram for malignant neoplasm of breast: Secondary | ICD-10-CM

## 2018-10-18 ENCOUNTER — Ambulatory Visit: Payer: No Typology Code available for payment source

## 2018-12-27 ENCOUNTER — Other Ambulatory Visit (HOSPITAL_COMMUNITY): Payer: Self-pay | Admitting: Obstetrics and Gynecology

## 2018-12-27 ENCOUNTER — Other Ambulatory Visit: Payer: Self-pay | Admitting: Family Medicine

## 2018-12-28 ENCOUNTER — Ambulatory Visit (HOSPITAL_COMMUNITY): Payer: No Typology Code available for payment source

## 2019-03-28 ENCOUNTER — Other Ambulatory Visit (HOSPITAL_COMMUNITY): Payer: Self-pay | Admitting: *Deleted

## 2019-03-28 DIAGNOSIS — Z1231 Encounter for screening mammogram for malignant neoplasm of breast: Secondary | ICD-10-CM

## 2019-04-03 ENCOUNTER — Ambulatory Visit (HOSPITAL_COMMUNITY): Payer: Self-pay

## 2019-05-22 ENCOUNTER — Ambulatory Visit (HOSPITAL_COMMUNITY)
Admission: EM | Admit: 2019-05-22 | Discharge: 2019-05-22 | Disposition: A | Discharge status: Home / Self Care | Payer: Self-pay | Attending: Family Medicine | Admitting: Family Medicine

## 2019-05-22 ENCOUNTER — Other Ambulatory Visit: Payer: Self-pay

## 2019-05-22 ENCOUNTER — Encounter (HOSPITAL_COMMUNITY): Payer: Self-pay | Admitting: Emergency Medicine

## 2019-05-22 DIAGNOSIS — M722 Plantar fascial fibromatosis: Secondary | ICD-10-CM

## 2019-05-22 MED ORDER — PREDNISONE 10 MG (21) PO TBPK: ORAL_TABLET | ORAL | 0 refills | Status: AC

## 2019-05-22 NOTE — Discharge Instructions (Addendum)
I believe this is plantar fasciitis I am giving this information on plantar fasciitis and things to do to help from inserts to different exercises. You need to rest, ice and elevate the foot as much as possible. I am giving you a couple days off work to rest We will try a short burst of steroids to see if this helps with your symptoms Follow up as needed for continued or worsening symptoms

## 2019-05-22 NOTE — ED Triage Notes (Signed)
Pt here for left foot pain x 2 weeks; pt sts hx of plantar fascitis in right foot and feels same

## 2019-05-23 NOTE — ED Provider Notes (Signed)
[facility]    CSN: [id number] Arrival date & time: 05/22/19  1225     History   Chief Complaint Chief Complaint  Patient presents with  . Foot Pain    appt 1230    HPI Sharon Gallegos is a 53 y.o. female.   Pt is a 53 year old female that presents with left foot pain x 2 weeks. Symptoms have been constant, waxing and waning. Worse after standing all day at work. Recently returned to work. Works in [employer] and stands on concrete floors all day. Hx of plantar fasciitis in the right foot.  Patient reports it feels the same. No falls or injury to the foot. Pain mostly in the left heel with some radiation in the lateral aspect of the foot. No numbness or tingling.  ROS per HPI      Past Medical History:  Diagnosis Date  . Hyperlipemia     There are no active problems to display for this patient.   Past Surgical History:  Procedure Laterality Date  . ANKLE SURGERY  1998   6 ankle surgeries   . APPENDECTOMY  1976/1977  . CHOLECYSTECTOMY  2009  . HERNIA REPAIR  11/17/2011  . lysis of adhesions    . MASS EXCISION  12/03/2011   Procedure: EXCISION MASS;  Surgeon: Emelia LoronMatthew Wakefield, MD;  Location: [facility];  Service: General;  Laterality: N/A;  excision umbilical nodule    OB History   No obstetric history on file.      Home Medications    Prior to Admission medications   Medication Sig Start Date End Date Taking? Authorizing Provider  hydrochlorothiazide (HYDRODIURIL) 25 MG tablet hydrochlorothiazide 25 mg tablet 07/24/18   [provider]  ibuprofen (ADVIL,MOTRIN) 200 MG tablet Take 400 mg by mouth every 6 (six) hours as needed. For pain     [provider]  levothyroxine (SYNTHROID, LEVOTHROID) 50 MCG tablet levothyroxine 50 mcg tablet 06/26/18   [provider]  methylPREDNISolone (MEDROL DOSEPAK) 4 MG TBPK tablet tad 09/15/18   Eustace MooreNelson, Yvonne Sue, MD  naproxen (NAPROSYN) 500 MG tablet Take 1 tablet (500 mg total) by mouth 2  (two) times daily. 09/18/14   Donnetta Hutchingook, Brian, MD  predniSONE (STERAPRED UNI-PAK 21 TAB) 10 MG (21) TBPK tablet 6 tabs for 1 day, then 5 tabs for 1 das, then 4 tabs for 1 day, then 3 tabs for 1 day, 2 tabs for 1 day, then 1 tab for 1 day 05/22/19   Dahlia ByesBast, Norrin Shreffler A, NP  traMADol (ULTRAM) 50 MG tablet tramadol 50 mg tablet    [provider]  UNKNOWN TO PATIENT BCPs    [provider]    Family History History reviewed. No pertinent family history.  Social History Social History   Tobacco Use  . Smoking status: Never Smoker  . Smokeless tobacco: Never Used  Substance Use Topics  . Alcohol use: No  . Drug use: No     Allergies   Butamben-tetracaine-benzocaine and Oxycodone   Review of Systems Review of Systems   Physical Exam Triage Vital Signs ED Triage Vitals [05/22/19 1255]  Enc Vitals Group     BP (!) 187/83     Pulse Rate 95     Resp 18     Temp 98.3 F (36.8 C)     Temp Source Oral     SpO2 98 %     Weight      Height  Head Circumference      Peak Flow      Pain Score 7     Pain Loc      Pain Edu?      Excl. in GC?    No data found.  Updated Vital Signs BP (!) 187/83 (BP Location: Right Arm)   Pulse 95   Temp 98.3 F (36.8 C) (Oral)   Resp 18   SpO2 98%   Visual Acuity Right Eye Distance:   Left Eye Distance:   Bilateral Distance:    Right Eye Near:   Left Eye Near:    Bilateral Near:     Physical Exam Vitals signs and nursing note reviewed.  Constitutional:      General: She is not in acute distress.    Appearance: Normal appearance. She is not ill-appearing, toxic-appearing or diaphoretic.  HENT:     Head: Normocephalic and atraumatic.     Nose: Nose normal.  Eyes:     Conjunctiva/sclera: Conjunctivae normal.  Pulmonary:     Effort: Pulmonary effort is normal.  Musculoskeletal: Normal range of motion.       Feet:     Comments: Tenderness to palpation of area marked.  No erythema, swelling or deformities.  No open  wounds or rashes.  Skin:    General: Skin is warm and dry.  Neurological:     Mental Status: She is alert.  Psychiatric:        Mood and Affect: Mood normal.      UC Treatments / Results  Labs (all labs ordered are listed, but only abnormal results are displayed) Labs Reviewed - No data to display  EKG   Radiology No results found.  Procedures Procedures (including critical care time)  Medications Ordered in UC Medications - No data to display  Initial Impression / Assessment and Plan / UC Course  I have reviewed the triage vital signs and the nursing notes.  Pertinent labs & imaging results that were available during my care of the patient were reviewed by me and considered in my medical decision making (see chart for details).     Symptoms consistent with plantar fasciitis.  History of same. Information given on plantar fasciitis from exercises to inserts. Recommended rest, ice, elevate Work note given to rest the foot Short burst of steroids to see if this helps with the pain and inflammation Recommended follow-up for continued or worsening symptoms   Final Clinical Impressions(s) / UC Diagnoses   Final diagnoses:  Plantar fasciitis of left foot     Discharge Instructions     I believe this is plantar fasciitis I am giving this information on plantar fasciitis and things to do to help from inserts to different exercises. You need to rest, ice and elevate the foot as much as possible. I am giving you a couple days off work to rest We will try a short burst of steroids to see if this helps with your symptoms Follow up as needed for continued or worsening symptoms      ED Prescriptions    Medication Sig Dispense Auth. Provider   predniSONE (STERAPRED UNI-PAK 21 TAB) 10 MG (21) TBPK tablet 6 tabs for 1 day, then 5 tabs for 1 das, then 4 tabs for 1 day, then 3 tabs for 1 day, 2 tabs for 1 day, then 1 tab for 1 day 21 tablet Jaci LazierBast, Jacora Hopkins A, NP      Controlled Substance Prescriptions [place] Controlled Substance Registry consulted? Not  Applicable   Orvan July, NP 05/23/19 1500

## 2019-05-24 ENCOUNTER — Ambulatory Visit: Payer: Self-pay | Admitting: Podiatry

## 2019-08-07 ENCOUNTER — Other Ambulatory Visit: Payer: Self-pay

## 2019-08-07 ENCOUNTER — Encounter (HOSPITAL_COMMUNITY): Payer: Self-pay

## 2019-08-07 ENCOUNTER — Other Ambulatory Visit: Payer: Self-pay | Admitting: Obstetrics and Gynecology

## 2019-08-07 ENCOUNTER — Ambulatory Visit
Admission: RE | Admit: 2019-08-07 | Discharge: 2019-08-07 | Disposition: A | Discharge status: Home / Self Care | Payer: No Typology Code available for payment source | Source: Ambulatory Visit | Attending: Obstetrics and Gynecology | Admitting: Obstetrics and Gynecology

## 2019-08-07 ENCOUNTER — Ambulatory Visit (HOSPITAL_COMMUNITY)
Admission: RE | Admit: 2019-08-07 | Discharge: 2019-08-07 | Disposition: A | Discharge status: Home / Self Care | Payer: Self-pay | Source: Ambulatory Visit | Attending: Obstetrics and Gynecology | Admitting: Obstetrics and Gynecology

## 2019-08-07 DIAGNOSIS — Z1239 Encounter for other screening for malignant neoplasm of breast: Secondary | ICD-10-CM

## 2019-08-07 DIAGNOSIS — Z1231 Encounter for screening mammogram for malignant neoplasm of breast: Secondary | ICD-10-CM

## 2019-08-07 HISTORY — DX: Essential (primary) hypertension: I10

## 2019-08-07 HISTORY — DX: Disorder of thyroid, unspecified: E07.9

## 2019-08-07 NOTE — Patient Instructions (Signed)
Explained breast self awareness with Sharon Gallegos. Patient refused her Pap smear today. Let her know [facility] will cover Pap smears every 3 years unless has a history of abnormal Pap smears and that she is due for a Pap smear. Referred patient to the [facility] for a screening mammogram. Appointment scheduled for Tuesday, August 07, 2019 at 0950. Patient aware of appointment and will be there. Let patient know [facility] will follow up with her within the next couple weeks with results of mammogram by letter or phone. [place] verbalized understanding.  Sharon Gallegos, Arvil Chaco, RN 9:03 AM

## 2019-08-07 NOTE — Progress Notes (Signed)
Complaints of yeast bilateral breast. Patient stated she has been given a prescription by her PCP.  Pap Smear: Pap smear not completed today. Last Pap smear was in 2012 at Dr. Teofilo Pod office and normal per patient. Patient refused a Pap smear today. Told patient she is due for a Pap smear and she can call [facility] to reschedule when ready. Per patient has no history of an abnormal Pap smear. No Pap smear results are in Epic.  Physical exam: Breasts Breasts symmetrical. Redness and yeast with odor observed bilateral lower breasts that was greater left breast. No nipple retraction bilateral breasts. No nipple discharge bilateral breasts. No lymphadenopathy. No lumps palpated bilateral breasts. No complaints of pain or tenderness on exam. Referred patient to the [facility] for a screening mammogram. Appointment scheduled for Tuesday, August 07, 2019 at 0950.        Pelvic/Bimanual No Pap smear completed today since patient refused.  Smoking History: Patient has never smoked.  Patient Navigation: Patient education provided. Access to services provided for patient through [facility] program.   Colorectal Cancer Screening: Per patient has never had a colonoscopy completed. No complaints today.   Breast and Cervical Cancer Risk Assessment: Patient has no family history of breast cancer, known genetic mutations, or radiation treatment to the chest before age 47. Patient has no history of cervical dysplasia, immunocompromised, or DES exposure in-utero.  Risk Assessment    Risk Scores      08/07/2019   Last edited by: Armond Hang, LPN   5-year risk: 1.3 %   Lifetime risk: 9.3 %

## 2019-08-08 ENCOUNTER — Encounter (HOSPITAL_COMMUNITY): Payer: Self-pay | Admitting: *Deleted

## 2021-04-01 ENCOUNTER — Other Ambulatory Visit: Payer: Self-pay

## 2021-04-01 ENCOUNTER — Emergency Department (HOSPITAL_BASED_OUTPATIENT_CLINIC_OR_DEPARTMENT_OTHER): Payer: BLUE CROSS/BLUE SHIELD | Admitting: Radiology

## 2021-04-01 ENCOUNTER — Encounter (HOSPITAL_BASED_OUTPATIENT_CLINIC_OR_DEPARTMENT_OTHER): Payer: Self-pay

## 2021-04-01 ENCOUNTER — Emergency Department (HOSPITAL_BASED_OUTPATIENT_CLINIC_OR_DEPARTMENT_OTHER)
Admission: EM | Admit: 2021-04-01 | Discharge: 2021-04-01 | Disposition: A | Discharge status: Home / Self Care | Payer: BLUE CROSS/BLUE SHIELD | Attending: Emergency Medicine | Admitting: Emergency Medicine

## 2021-04-01 DIAGNOSIS — J40 Bronchitis, not specified as acute or chronic: Secondary | ICD-10-CM | POA: Insufficient documentation

## 2021-04-01 DIAGNOSIS — Z79899 Other long term (current) drug therapy: Secondary | ICD-10-CM | POA: Diagnosis not present

## 2021-04-01 DIAGNOSIS — I10 Essential (primary) hypertension: Secondary | ICD-10-CM | POA: Insufficient documentation

## 2021-04-01 DIAGNOSIS — R059 Cough, unspecified: Secondary | ICD-10-CM | POA: Diagnosis present

## 2021-04-01 MED ORDER — ALBUTEROL SULFATE HFA 108 (90 BASE) MCG/ACT IN AERS: 1.0000 | INHALATION_SPRAY | Freq: Four times a day (QID) | RESPIRATORY_TRACT | 1 refills | Status: AC | PRN

## 2021-04-01 MED ORDER — PREDNISONE 10 MG PO TABS: 40.0000 mg | ORAL_TABLET | Freq: Every day | ORAL | 0 refills | Status: AC

## 2021-04-01 MED ORDER — PREDNISONE 10 MG PO TABS: 40.0000 mg | ORAL_TABLET | Freq: Every day | ORAL | 0 refills | Status: DC

## 2021-04-01 NOTE — Discharge Instructions (Signed)
This x-ray here today negative.  Work note provided.  Trial of albuterol inhaler 2 puffs every 6 hours at least for the next 7 days and then as needed.  Also take the prednisone as directed for the next 5 days.  Make an appointment to follow-up with your primary care doctor.  Would recommend may be consideration for follow-up with an allergist or follow-up with pulmonary medicine since this has been ongoing since March.

## 2021-04-01 NOTE — ED Provider Notes (Signed)
[facility] EMERGENCY DEPT Provider Note   CSN: [id number] Arrival date & time: 04/01/21  1791     History Chief Complaint  Patient presents with  . URI    Sharon Gallegos is a 55 y.o. female.  [facility]  Patient has been having difficulty with a significant cough and congestion for 2 months.  Patient states in the past she has had problems with bronchitis.  Patient works in childcare.  Patient has tried over-the-counter allergy medicines as well as cough medicines without any success.  Patient has not had a chest x-ray since the onset of this.        Past Medical History:  Diagnosis Date  . Hyperlipemia   . Hypertension   . Thyroid disease     Patient Active Problem List   Diagnosis Date Noted  . Screening breast examination 08/07/2019    Past Surgical History:  Procedure Laterality Date  . ANKLE SURGERY  1998   6 ankle surgeries   . APPENDECTOMY  1976/1977  . CHOLECYSTECTOMY  2009  . HERNIA REPAIR  11/17/2011  . lysis of adhesions    . MASS EXCISION  12/03/2011   Procedure: EXCISION MASS;  Surgeon: Emelia Loron, MD;  Location: [facility];  Service: General;  Laterality: N/A;  excision umbilical nodule     OB History    Gravida  0   Para  0   Term  0   Preterm  0   AB  0   Living  0     SAB  0   IAB  0   Ectopic  0   Multiple  0   Live Births  0           Family History  Problem Relation Age of Onset  . Hypertension Father     Social History   Tobacco Use  . Smoking status: Never Smoker  . Smokeless tobacco: Never Used  Vaping Use  . Vaping Use: Never used  Substance Use Topics  . Alcohol use: No  . Drug use: No    Home Medications Prior to Admission medications   Medication Sig Start Date End Date Taking? Authorizing Provider  albuterol (VENTOLIN HFA) 108 (90 Base) MCG/ACT inhaler Inhale 1-2 puffs into the lungs every 6 (six) hours as needed for wheezing or  shortness of breath. 04/01/21  Yes Vanetta Mulders, MD  predniSONE (DELTASONE) 10 MG tablet Take 4 tablets (40 mg total) by mouth daily. 04/01/21  Yes Vanetta Mulders, MD  hydrochlorothiazide (HYDRODIURIL) 25 MG tablet hydrochlorothiazide 25 mg tablet 07/24/18   [provider]  ibuprofen (ADVIL,MOTRIN) 200 MG tablet Take 400 mg by mouth every 6 (six) hours as needed. For pain     [provider]  levothyroxine (SYNTHROID, LEVOTHROID) 50 MCG tablet levothyroxine 50 mcg tablet 06/26/18   [provider]  methylPREDNISolone (MEDROL DOSEPAK) 4 MG TBPK tablet tad Patient not taking: Reported on 08/07/2019 09/15/18   Eustace Moore, MD  naproxen (NAPROSYN) 500 MG tablet Take 1 tablet (500 mg total) by mouth 2 (two) times daily. Patient not taking: Reported on 08/07/2019 09/18/14   Donnetta Hutching, MD  predniSONE (STERAPRED UNI-PAK 21 TAB) 10 MG (21) TBPK tablet 6 tabs for 1 day, then 5 tabs for 1 das, then 4 tabs for 1 day, then 3 tabs for 1 day, 2 tabs for 1 day, then 1 tab for 1 day Patient not taking: Reported on 08/07/2019 05/22/19  Dahlia Byes A, NP  traMADol (ULTRAM) 50 MG tablet tramadol 50 mg tablet    [provider]  UNKNOWN TO PATIENT BCPs    [provider]    Allergies    Butamben-tetracaine-benzocaine and Oxycodone  Review of Systems   Review of Systems  Constitutional: Negative for chills and fever.  HENT: Positive for congestion. Negative for rhinorrhea and sore throat.   Eyes: Negative for visual disturbance.  Respiratory: Positive for cough. Negative for shortness of breath and wheezing.   Cardiovascular: Negative for chest pain and leg swelling.  Gastrointestinal: Negative for abdominal pain, diarrhea, nausea and vomiting.  Genitourinary: Negative for dysuria.  Musculoskeletal: Negative for back pain and neck pain.  Skin: Negative for rash.  Neurological: Negative for dizziness, light-headedness and headaches.  Hematological: Does not  bruise/bleed easily.  Psychiatric/Behavioral: Negative for confusion.    Physical Exam Updated Vital Signs BP (!) 188/96 (BP Location: Right Arm)   Pulse 86   Temp 98.2 F (36.8 C) (Oral)   Resp 20   SpO2 100%   Physical Exam Vitals and nursing note reviewed.  Constitutional:      General: She is not in acute distress.    Appearance: Normal appearance. She is well-developed. She is obese.  HENT:     Head: Normocephalic and atraumatic.  Eyes:     Extraocular Movements: Extraocular movements intact.     Conjunctiva/sclera: Conjunctivae normal.     Pupils: Pupils are equal, round, and reactive to light.  Cardiovascular:     Rate and Rhythm: Normal rate and regular rhythm.     Heart sounds: No murmur heard.   Pulmonary:     Effort: Pulmonary effort is normal. No respiratory distress.     Breath sounds: Normal breath sounds. No wheezing or rales.  Abdominal:     Palpations: Abdomen is soft.     Tenderness: There is no abdominal tenderness.  Musculoskeletal:        General: Normal range of motion.     Cervical back: Normal range of motion and neck supple.  Skin:    General: Skin is warm and dry.     Capillary Refill: Capillary refill takes less than 2 seconds.  Neurological:     General: No focal deficit present.     Mental Status: She is alert and oriented to person, place, and time.     ED Results / Procedures / Treatments   Labs (all labs ordered are listed, but only abnormal results are displayed) Labs Reviewed - No data to display  EKG None  Radiology DG Chest 2 View  Result Date: 04/01/2021 CLINICAL DATA:  Cough for 2 months. EXAM: CHEST - 2 VIEW COMPARISON:  September 18, 2014. FINDINGS: The heart size and mediastinal contours are within normal limits. No consolidation. Rounded opacity at the anterior right first rib, favored to represent asymmetric costochondral cartilage and confluence of shadows from rib overlap. No visible pleural effusions or  pneumothorax. No acute osseous abnormality. IMPRESSION: No active cardiopulmonary disease. Electronically Signed   By: Feliberto Harts MD   On: 04/01/2021 09:55    Procedures Procedures   Medications Ordered in ED Medications - No data to display  ED Course  I have reviewed the triage vital signs and the nursing notes.  Pertinent labs & imaging results that were available during my care of the patient were reviewed by me and considered in my medical decision making (see chart for details).    MDM Rules/Calculators/A&P  Lung exam without any wheezing.  The patient with a fairly significant deep cough.  It is intermittent.  Chest x-ray negative.  We will give a trial of albuterol inhaler and prednisone.  Work note provided.  Recommend follow back up with primary care doctor.  May be consideration for follow-up with pulmonary medicine and/or allergist.  Patient is not a smoker.     Final Clinical Impression(s) / ED Diagnoses Final diagnoses:  Bronchitis    Rx / DC Orders ED Discharge Orders         Ordered    predniSONE (DELTASONE) 10 MG tablet  Daily        04/01/21 1027    albuterol (VENTOLIN HFA) 108 (90 Base) MCG/ACT inhaler  Every 6 hours PRN        04/01/21 1027           Vanetta Mulders, MD 04/01/21 1030

## 2021-04-01 NOTE — ED Triage Notes (Signed)
She reports having a congested cough x 2 months/worse past two weeks. 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 She is in no distress.

## 2021-04-22 ENCOUNTER — Encounter (HOSPITAL_BASED_OUTPATIENT_CLINIC_OR_DEPARTMENT_OTHER): Payer: Self-pay | Admitting: *Deleted

## 2021-04-22 ENCOUNTER — Emergency Department (HOSPITAL_BASED_OUTPATIENT_CLINIC_OR_DEPARTMENT_OTHER)
Admission: EM | Admit: 2021-04-22 | Discharge: 2021-04-22 | Disposition: A | Discharge status: Home / Self Care | Payer: BLUE CROSS/BLUE SHIELD | Attending: Emergency Medicine | Admitting: Emergency Medicine

## 2021-04-22 ENCOUNTER — Emergency Department (HOSPITAL_BASED_OUTPATIENT_CLINIC_OR_DEPARTMENT_OTHER): Payer: BLUE CROSS/BLUE SHIELD | Admitting: Radiology

## 2021-04-22 ENCOUNTER — Other Ambulatory Visit: Payer: Self-pay

## 2021-04-22 DIAGNOSIS — Z79899 Other long term (current) drug therapy: Secondary | ICD-10-CM | POA: Insufficient documentation

## 2021-04-22 DIAGNOSIS — I10 Essential (primary) hypertension: Secondary | ICD-10-CM | POA: Diagnosis not present

## 2021-04-22 DIAGNOSIS — J40 Bronchitis, not specified as acute or chronic: Secondary | ICD-10-CM | POA: Diagnosis not present

## 2021-04-22 DIAGNOSIS — R059 Cough, unspecified: Secondary | ICD-10-CM | POA: Diagnosis present

## 2021-04-22 MED ORDER — PANTOPRAZOLE SODIUM 20 MG PO TBEC: 40.0000 mg | DELAYED_RELEASE_TABLET | Freq: Every day | ORAL | 0 refills | Status: AC

## 2021-04-22 MED ORDER — DEXAMETHASONE 6 MG PO TABS
10.0000 mg | ORAL_TABLET | Freq: Once | ORAL | Status: AC
  Administered 2021-04-22: 10 mg via ORAL
  Filled 2021-04-22: qty 1

## 2021-04-22 MED ORDER — AZITHROMYCIN 250 MG PO TABS: 250.0000 mg | ORAL_TABLET | Freq: Every day | ORAL | 0 refills | Status: AC

## 2021-04-22 NOTE — ED Triage Notes (Signed)
Cont to have a congested cough, no relief from meds, steroids.

## 2021-04-22 NOTE — ED Provider Notes (Signed)
[facility] EMERGENCY DEPT Provider Note   CSN: [id number] Arrival date & time: 04/22/21  6861     History Chief Complaint  Patient presents with  . Cough    Sharon Gallegos is a 55 y.o. female.  HPI      55 year old female with a history of hypertension, hyperlipidemia, visit to the emergency department on May 18 for cough and congestion for 2 months, presents with concern for continuing symptoms.  On May 18, check had chest x-ray which showed no evidence of pneumonia.  She was given a trial of albuterol inhaler and prednisone.  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  She reports she initially improved with the prednisone with her cough, however felt that she had some abdominal pain and cramping with it, and did not like the side effects.  While the cough somewhat improved, it continued, and she has had continued cough and congestion, which has been continuous for the last 2-1/2 months.  Reports she feels that it could be allergies.  She would like a note to that says she can go back to work, but would not like a COVID test.  She denies any chest pain, shortness of breath, orthopnea or leg swelling.  Reports she had a remote ankle surgery and has some swelling around that area and intermittently, but no change.  Reports she is has slept in a recliner ever since her husband died 4 years ago.  Denies fevers, nausea, vomiting, sore throat.  Reports that her mother had a history of smoking, but she never personally smoked.  No known history of lung disease. Coughing up clear sputum.   Past Medical History:  Diagnosis Date  . Hyperlipemia   . Hypertension   . Thyroid disease     Patient Active Problem List   Diagnosis Date Noted  . Screening breast examination 08/07/2019    Past Surgical History:  Procedure Laterality Date  . ANKLE SURGERY  1998   6 ankle surgeries   . APPENDECTOMY  1976/1977  . CHOLECYSTECTOMY  2009  . HERNIA REPAIR  11/17/2011  . lysis of adhesions    . MASS  EXCISION  12/03/2011   Procedure: EXCISION MASS;  Surgeon: Emelia Loron, MD;  Location: [facility];  Service: General;  Laterality: N/A;  excision umbilical nodule     OB History    Gravida  0   Para  0   Term  0   Preterm  0   AB  0   Living  0     SAB  0   IAB  0   Ectopic  0   Multiple  0   Live Births  0           Family History  Problem Relation Age of Onset  . Hypertension Father     Social History   Tobacco Use  . Smoking status: Never Smoker  . Smokeless tobacco: Never Used  Vaping Use  . Vaping Use: Never used  Substance Use Topics  . Alcohol use: No  . Drug use: No    Home Medications Prior to Admission medications   Medication Sig Start Date End Date Taking? Authorizing Provider  azithromycin (ZITHROMAX) 250 MG tablet Take 1 tablet (250 mg total) by mouth daily. Take first 2 tablets together, then 1 every day until finished. 04/22/21  Yes Alvira Monday, MD  pantoprazole (PROTONIX) 20 MG tablet Take 2 tablets (40 mg total) by mouth daily for 14 days. 04/22/21 05/06/21 Yes Courtland Coppa, Denny Peon,  MD  albuterol (VENTOLIN HFA) 108 (90 Base) MCG/ACT inhaler Inhale 1-2 puffs into the lungs every 6 (six) hours as needed for wheezing or shortness of breath. 04/01/21   Vanetta Mulders, MD  hydrochlorothiazide (HYDRODIURIL) 25 MG tablet hydrochlorothiazide 25 mg tablet 07/24/18   [provider]  ibuprofen (ADVIL,MOTRIN) 200 MG tablet Take 400 mg by mouth every 6 (six) hours as needed. For pain     [provider]  levothyroxine (SYNTHROID, LEVOTHROID) 50 MCG tablet levothyroxine 50 mcg tablet 06/26/18   [provider]  methylPREDNISolone (MEDROL DOSEPAK) 4 MG TBPK tablet tad Patient not taking: Reported on 08/07/2019 09/15/18   Eustace Moore, MD  naproxen (NAPROSYN) 500 MG tablet Take 1 tablet (500 mg total) by mouth 2 (two) times daily. Patient not taking: Reported on 08/07/2019 09/18/14   Donnetta Hutching, MD  predniSONE (DELTASONE) 10  MG tablet Take 4 tablets (40 mg total) by mouth daily. 04/01/21   Vanetta Mulders, MD  predniSONE (STERAPRED UNI-PAK 21 TAB) 10 MG (21) TBPK tablet 6 tabs for 1 day, then 5 tabs for 1 das, then 4 tabs for 1 day, then 3 tabs for 1 day, 2 tabs for 1 day, then 1 tab for 1 day Patient not taking: Reported on 08/07/2019 05/22/19   Janace Aris, NP  traMADol (ULTRAM) 50 MG tablet tramadol 50 mg tablet    [provider]  UNKNOWN TO PATIENT BCPs    [provider]    Allergies    Butamben-tetracaine-benzocaine and Oxycodone  Review of Systems   Review of Systems  Constitutional: Negative for fever.  HENT: Negative for sore throat.   Respiratory: Positive for cough. Negative for shortness of breath.   Cardiovascular: Negative for chest pain and leg swelling.  Gastrointestinal: Negative for abdominal pain, nausea and vomiting.  Skin: Negative for rash.  Neurological: Negative for syncope.    Physical Exam Updated Vital Signs BP (!) 168/96 (BP Location: Right Arm)   Pulse 84   Temp 98.5 F (36.9 C) (Oral)   Resp 18   Ht 5\' 3"  (1.6 m)   Wt 127 kg   SpO2 96%   BMI 49.60 kg/m   Physical Exam Vitals and nursing note reviewed.  Constitutional:      General: She is not in acute distress.    Appearance: She is well-developed. She is not diaphoretic.  HENT:     Head: Normocephalic and atraumatic.  Eyes:     Conjunctiva/sclera: Conjunctivae normal.  Cardiovascular:     Rate and Rhythm: Normal rate and regular rhythm.     Heart sounds: Normal heart sounds. No murmur heard. No friction rub. No gallop.   Pulmonary:     Effort: Pulmonary effort is normal. No respiratory distress.     Breath sounds: Normal breath sounds. No wheezing or rales.  Musculoskeletal:        General: No tenderness.     Cervical back: Normal range of motion.  Skin:    General: Skin is warm and dry.     Findings: No erythema or rash.  Neurological:     Mental Status: She is alert and oriented  to person, place, and time.     ED Results / Procedures / Treatments   Labs (all labs ordered are listed, but only abnormal results are displayed) Labs Reviewed - No data to display  EKG None  Radiology DG Chest 2 View  Result Date: 04/22/2021 CLINICAL DATA:  Cough. EXAM: CHEST - 2 VIEW  COMPARISON:  Apr 01, 2021 FINDINGS: Cardiomediastinal silhouette is normal. Mediastinal contours appear intact. There is no evidence of focal airspace consolidation, pleural effusion or pneumothorax. Minimal peribronchial thickening with central predominance. Osseous structures are without acute abnormality. Soft tissues are grossly normal. IMPRESSION: Minimal peribronchial thickening with central predominance usually seen with acute bronchitis or reactive airway disease. Electronically Signed   By: Ted Mcalpine M.D.   On: 04/22/2021 11:00    Procedures Procedures   Medications Ordered in ED Medications  dexamethasone (DECADRON) tablet 10 mg (10 mg Oral Given 04/22/21 1043)    ED Course  I have reviewed the triage vital signs and the nursing notes.  Pertinent labs & imaging results that were available during my care of the patient were reviewed by me and considered in my medical decision making (see chart for details).    MDM Rules/Calculators/A&P                          55 year old female with a history of hypertension, hyperlipidemia, visit to the emergency department on May 18 for cough and congestion for 2 months, presents with concern for continuing symptoms.  Denies chest pain or dyspnea, history and exam are not consistent with CHF, PE.    CXR repeated given duration of symptoms shows findings which may be consistent with bronchitis.  Given duration of symptoms, feel it is reasonable to cover for bacterial bronchitis with azithrmoycin. .  Discussed ddx for chronic cough with most likely diagnoses including GERD, RAD, post-nasal drip however there are other important etiologies and I  recommend follow up with her PCP for further evaluation.  Discussed I do not feel comfortable writing for her to return to work without a COVID 19 test given she is here for cough.   Will start PPI for possible GERD, give dose of decadron for possible underlying RAD, and azithromycin for possible bacterial bronchitis.   Final Clinical Impression(s) / ED Diagnoses Final diagnoses:  Cough  Bronchitis    Rx / DC Orders ED Discharge Orders         Ordered    azithromycin (ZITHROMAX) 250 MG tablet  Daily        04/22/21 1103    pantoprazole (PROTONIX) 20 MG tablet  Daily        04/22/21 1103           Alvira Monday, MD 04/22/21 1738

## 2021-04-22 NOTE — ED Notes (Signed)
Patient transported to X-ray 

## 2024-10-09 ENCOUNTER — Ambulatory Visit (HOSPITAL_BASED_OUTPATIENT_CLINIC_OR_DEPARTMENT_OTHER): Admitting: Physical Therapy

## 2024-10-16 ENCOUNTER — Ambulatory Visit (HOSPITAL_BASED_OUTPATIENT_CLINIC_OR_DEPARTMENT_OTHER): Admitting: Physical Therapy

## 2024-10-23 ENCOUNTER — Ambulatory Visit (HOSPITAL_BASED_OUTPATIENT_CLINIC_OR_DEPARTMENT_OTHER): Admitting: Physical Therapy

## 2024-10-30 ENCOUNTER — Ambulatory Visit (HOSPITAL_BASED_OUTPATIENT_CLINIC_OR_DEPARTMENT_OTHER): Admitting: Physical Therapy
